# Patient Record
Sex: Male | Born: 1965 | Race: White | Hispanic: No | Smoking: Never smoker
Health system: Southern US, Community
[De-identification: ages and names within clinical notes are randomized; demographics above are authoritative.]

## PROBLEM LIST (undated history)

## (undated) DIAGNOSIS — Z9889 Other specified postprocedural states: Secondary | ICD-10-CM

## (undated) DIAGNOSIS — K219 Gastro-esophageal reflux disease without esophagitis: Secondary | ICD-10-CM

## (undated) DIAGNOSIS — J209 Acute bronchitis, unspecified: Secondary | ICD-10-CM

## (undated) DIAGNOSIS — F329 Major depressive disorder, single episode, unspecified: Secondary | ICD-10-CM

## (undated) DIAGNOSIS — M722 Plantar fascial fibromatosis: Secondary | ICD-10-CM

## (undated) DIAGNOSIS — E559 Vitamin D deficiency, unspecified: Secondary | ICD-10-CM

## (undated) DIAGNOSIS — I10 Essential (primary) hypertension: Secondary | ICD-10-CM

## (undated) DIAGNOSIS — K589 Irritable bowel syndrome without diarrhea: Secondary | ICD-10-CM

## (undated) DIAGNOSIS — R000 Tachycardia, unspecified: Secondary | ICD-10-CM

## (undated) DIAGNOSIS — N200 Calculus of kidney: Secondary | ICD-10-CM

## (undated) DIAGNOSIS — Z8719 Personal history of other diseases of the digestive system: Secondary | ICD-10-CM

## (undated) DIAGNOSIS — G971 Other reaction to spinal and lumbar puncture: Secondary | ICD-10-CM

## (undated) DIAGNOSIS — M797 Fibromyalgia: Secondary | ICD-10-CM

## (undated) DIAGNOSIS — Z9049 Acquired absence of other specified parts of digestive tract: Secondary | ICD-10-CM

## (undated) DIAGNOSIS — E7800 Pure hypercholesterolemia, unspecified: Secondary | ICD-10-CM

## (undated) DIAGNOSIS — E162 Hypoglycemia, unspecified: Secondary | ICD-10-CM

## (undated) DIAGNOSIS — G8929 Other chronic pain: Secondary | ICD-10-CM

## (undated) DIAGNOSIS — G4730 Sleep apnea, unspecified: Secondary | ICD-10-CM

## (undated) DIAGNOSIS — T883XXA Malignant hyperthermia due to anesthesia, initial encounter: Secondary | ICD-10-CM

## (undated) DIAGNOSIS — E349 Endocrine disorder, unspecified: Secondary | ICD-10-CM

## (undated) DIAGNOSIS — R112 Nausea with vomiting, unspecified: Secondary | ICD-10-CM

## (undated) DIAGNOSIS — F32A Depression, unspecified: Secondary | ICD-10-CM

## (undated) DIAGNOSIS — M549 Dorsalgia, unspecified: Secondary | ICD-10-CM

## (undated) DIAGNOSIS — M069 Rheumatoid arthritis, unspecified: Secondary | ICD-10-CM

## (undated) DIAGNOSIS — S060XAA Concussion with loss of consciousness status unknown, initial encounter: Secondary | ICD-10-CM

## (undated) DIAGNOSIS — F419 Anxiety disorder, unspecified: Secondary | ICD-10-CM

## (undated) DIAGNOSIS — H53002 Unspecified amblyopia, left eye: Secondary | ICD-10-CM

## (undated) DIAGNOSIS — Z8489 Family history of other specified conditions: Secondary | ICD-10-CM

## (undated) DIAGNOSIS — Z9109 Other allergy status, other than to drugs and biological substances: Secondary | ICD-10-CM

## (undated) DIAGNOSIS — Z8711 Personal history of peptic ulcer disease: Secondary | ICD-10-CM

## (undated) DIAGNOSIS — S060X9A Concussion with loss of consciousness of unspecified duration, initial encounter: Secondary | ICD-10-CM

## (undated) DIAGNOSIS — M5124 Other intervertebral disc displacement, thoracic region: Secondary | ICD-10-CM

## (undated) DIAGNOSIS — K469 Unspecified abdominal hernia without obstruction or gangrene: Secondary | ICD-10-CM

## (undated) HISTORY — PX: APPENDECTOMY: SHX54

## (undated) HISTORY — PX: BACK SURGERY: SHX140

## (undated) HISTORY — PX: KNEE SURGERY: SHX244

## (undated) HISTORY — PX: HERNIA REPAIR: SHX51

## (undated) HISTORY — PX: COLONOSCOPY W/ BIOPSIES AND POLYPECTOMY: SHX1376

## (undated) HISTORY — PX: KNEE CARTILAGE SURGERY: SHX688

## (undated) HISTORY — PX: CARDIAC CATHETERIZATION: SHX172

---

## 1998-05-31 ENCOUNTER — Emergency Department (HOSPITAL_COMMUNITY): Admission: EM | Admit: 1998-05-31 | Discharge: 1998-05-31 | Payer: Self-pay | Admitting: Emergency Medicine

## 1998-06-20 ENCOUNTER — Encounter: Admission: RE | Admit: 1998-06-20 | Discharge: 1998-06-29 | Payer: Self-pay | Admitting: *Deleted

## 1998-07-27 ENCOUNTER — Emergency Department (HOSPITAL_COMMUNITY): Admission: EM | Admit: 1998-07-27 | Discharge: 1998-07-27 | Payer: Self-pay | Admitting: Emergency Medicine

## 1998-07-27 ENCOUNTER — Encounter: Payer: Self-pay | Admitting: Emergency Medicine

## 1999-04-30 ENCOUNTER — Encounter: Payer: Self-pay | Admitting: Emergency Medicine

## 1999-04-30 ENCOUNTER — Emergency Department (HOSPITAL_COMMUNITY): Admission: EM | Admit: 1999-04-30 | Discharge: 1999-04-30 | Payer: Self-pay | Admitting: Emergency Medicine

## 1999-07-12 ENCOUNTER — Encounter: Payer: Self-pay | Admitting: Emergency Medicine

## 1999-07-12 ENCOUNTER — Emergency Department (HOSPITAL_COMMUNITY): Admission: EM | Admit: 1999-07-12 | Discharge: 1999-07-12 | Payer: Self-pay | Admitting: Emergency Medicine

## 2003-04-03 ENCOUNTER — Emergency Department (HOSPITAL_COMMUNITY): Admission: EM | Admit: 2003-04-03 | Discharge: 2003-04-03 | Payer: Self-pay | Admitting: Emergency Medicine

## 2005-01-15 ENCOUNTER — Emergency Department (HOSPITAL_COMMUNITY): Admission: EM | Admit: 2005-01-15 | Discharge: 2005-01-15 | Payer: Self-pay | Admitting: Emergency Medicine

## 2005-01-22 ENCOUNTER — Emergency Department (HOSPITAL_COMMUNITY): Admission: EM | Admit: 2005-01-22 | Discharge: 2005-01-22 | Payer: Self-pay | Admitting: Emergency Medicine

## 2005-03-04 ENCOUNTER — Emergency Department (HOSPITAL_COMMUNITY): Admission: EM | Admit: 2005-03-04 | Discharge: 2005-03-04 | Payer: Self-pay | Admitting: Emergency Medicine

## 2005-03-28 ENCOUNTER — Emergency Department (HOSPITAL_COMMUNITY): Admission: EM | Admit: 2005-03-28 | Discharge: 2005-03-28 | Payer: Self-pay | Admitting: Emergency Medicine

## 2005-06-10 ENCOUNTER — Emergency Department (HOSPITAL_COMMUNITY): Admission: EM | Admit: 2005-06-10 | Discharge: 2005-06-10 | Payer: Self-pay | Admitting: Emergency Medicine

## 2005-08-14 ENCOUNTER — Emergency Department (HOSPITAL_COMMUNITY): Admission: EM | Admit: 2005-08-14 | Discharge: 2005-08-14 | Payer: Self-pay | Admitting: Emergency Medicine

## 2005-10-26 ENCOUNTER — Emergency Department (HOSPITAL_COMMUNITY): Admission: EM | Admit: 2005-10-26 | Discharge: 2005-10-26 | Payer: Self-pay | Admitting: Emergency Medicine

## 2005-12-06 ENCOUNTER — Emergency Department (HOSPITAL_COMMUNITY): Admission: EM | Admit: 2005-12-06 | Discharge: 2005-12-07 | Payer: Self-pay | Admitting: *Deleted

## 2005-12-29 ENCOUNTER — Emergency Department (HOSPITAL_COMMUNITY): Admission: EM | Admit: 2005-12-29 | Discharge: 2005-12-29 | Payer: Self-pay | Admitting: Emergency Medicine

## 2006-03-11 ENCOUNTER — Emergency Department (HOSPITAL_COMMUNITY): Admission: EM | Admit: 2006-03-11 | Discharge: 2006-03-11 | Payer: Self-pay | Admitting: Emergency Medicine

## 2006-04-27 ENCOUNTER — Emergency Department (HOSPITAL_COMMUNITY): Admission: EM | Admit: 2006-04-27 | Discharge: 2006-04-27 | Payer: Self-pay | Admitting: Emergency Medicine

## 2006-07-03 ENCOUNTER — Ambulatory Visit: Payer: Self-pay | Admitting: Gastroenterology

## 2006-09-04 ENCOUNTER — Emergency Department (HOSPITAL_COMMUNITY): Admission: EM | Admit: 2006-09-04 | Discharge: 2006-09-04 | Payer: Self-pay | Admitting: Emergency Medicine

## 2006-09-10 ENCOUNTER — Encounter: Admission: RE | Admit: 2006-09-10 | Discharge: 2006-09-10 | Payer: Self-pay | Admitting: Internal Medicine

## 2007-02-01 ENCOUNTER — Emergency Department (HOSPITAL_COMMUNITY): Admission: EM | Admit: 2007-02-01 | Discharge: 2007-02-01 | Payer: Self-pay | Admitting: Emergency Medicine

## 2007-04-05 ENCOUNTER — Emergency Department (HOSPITAL_COMMUNITY): Admission: EM | Admit: 2007-04-05 | Discharge: 2007-04-05 | Payer: Self-pay | Admitting: Emergency Medicine

## 2007-04-14 IMAGING — CR DG CHEST 2V
2 series · 2 of 2 positions shown · non-contrast
Comparison: none

CLINICAL DATA: Cough and patient with elevated blood pressure.  
 CHEST - 2 VIEW:

[view not recorded (1 of 2)]
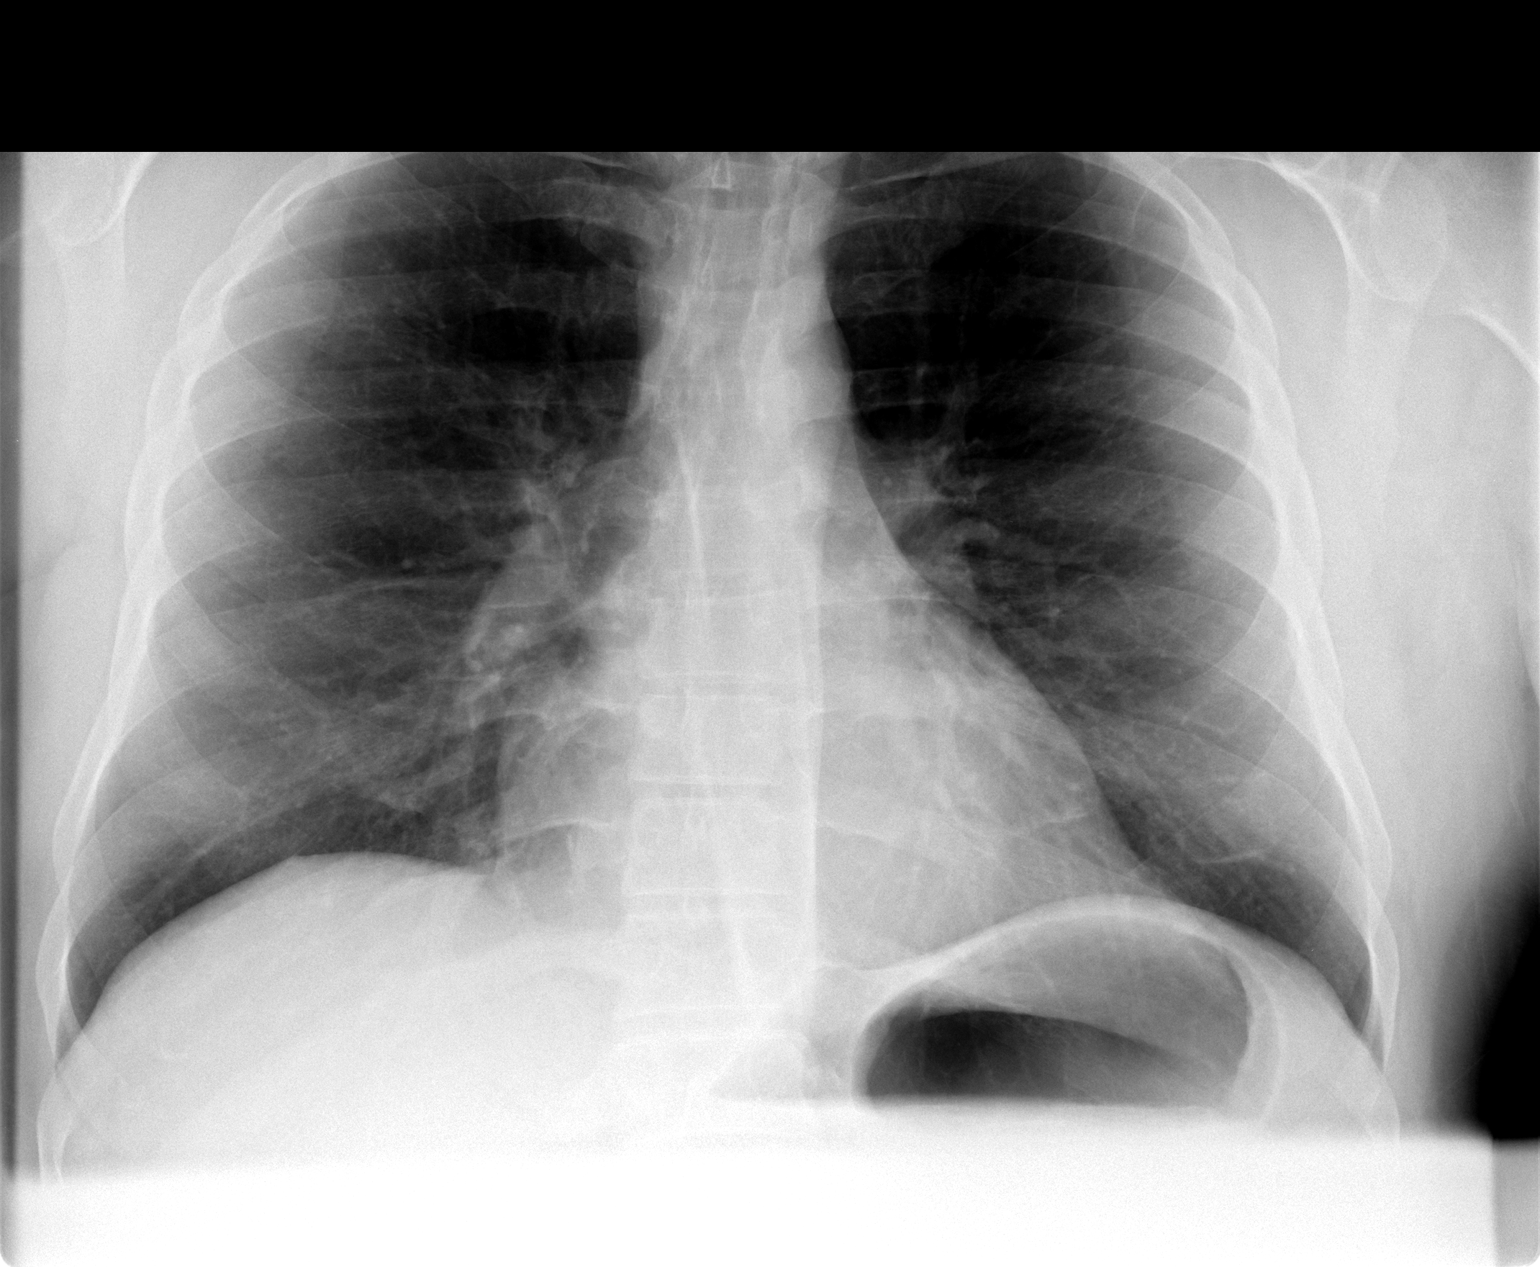

[view not recorded (2 of 2)]
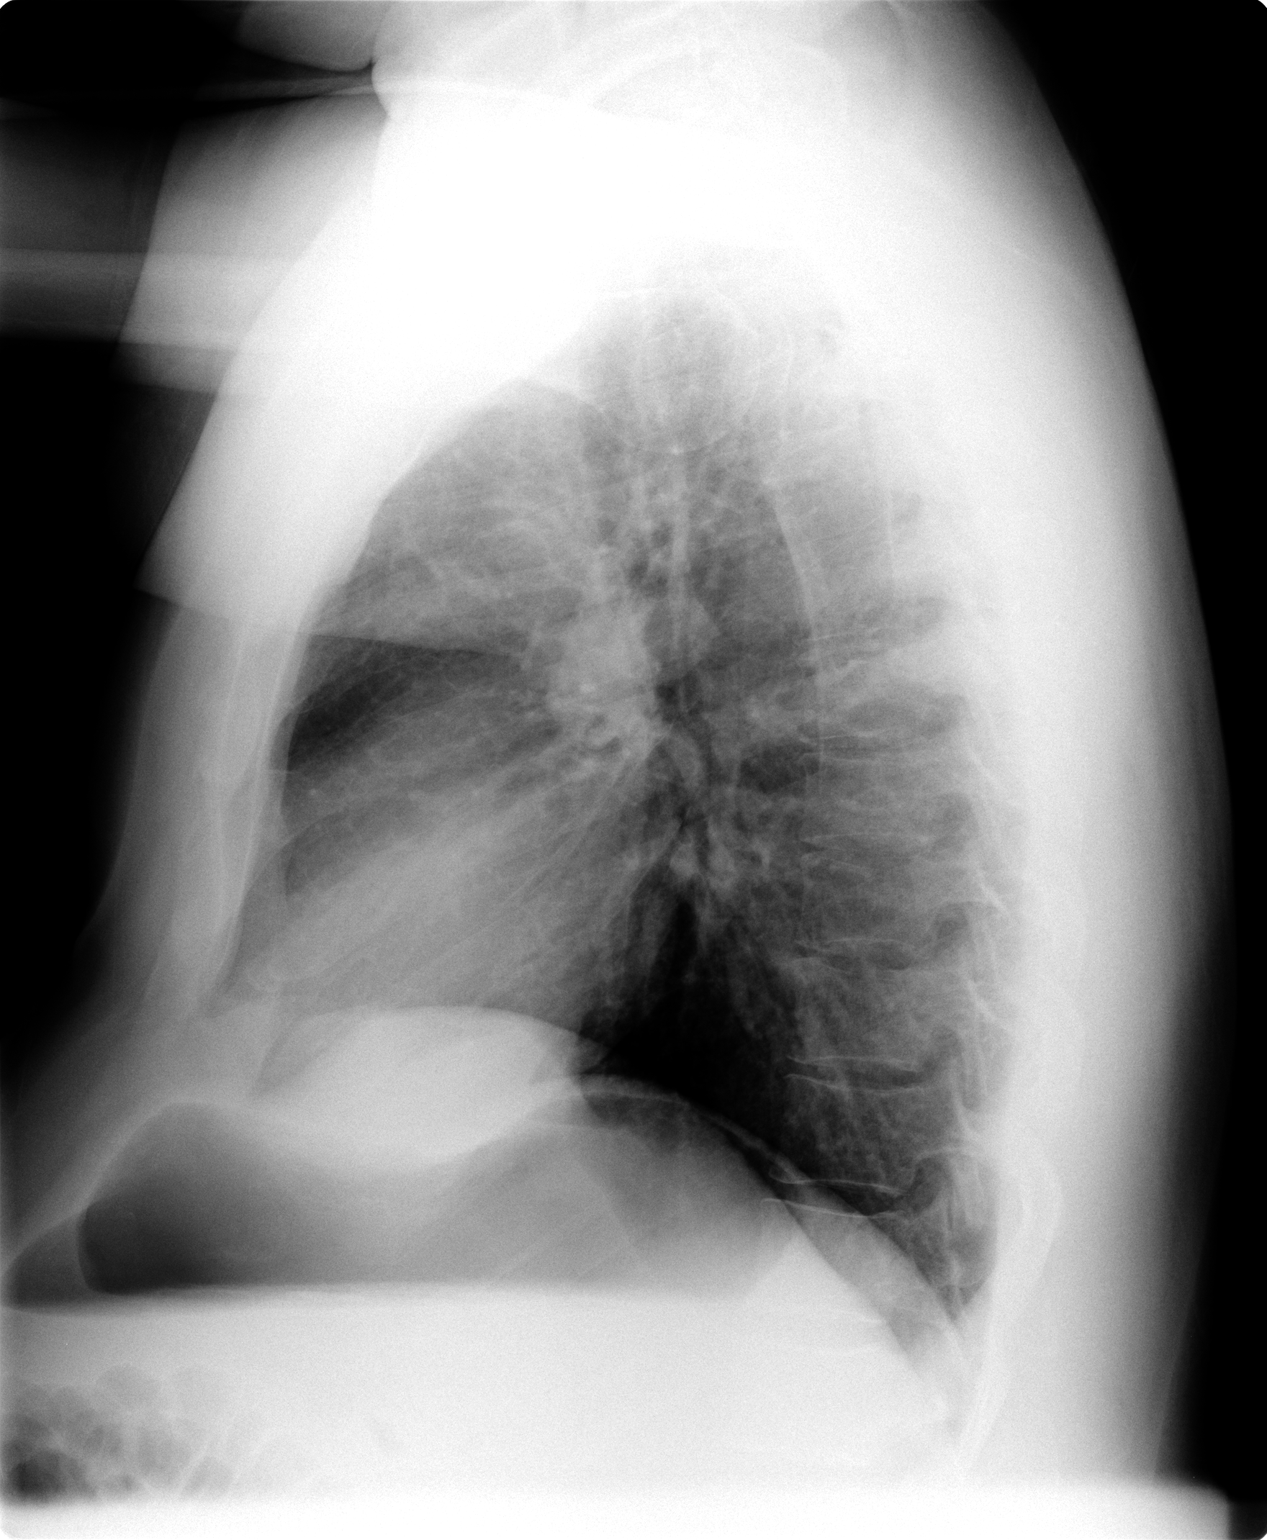

[2 of 2 positions shown; findings below may reference images not displayed]

FINDINGS: There is a small focus of linear opacity in the left base compatible with scar.  Lungs otherwise clear.  Heart size normal.  No effusion.
IMPRESSION: No acute disease.

## 2007-06-15 ENCOUNTER — Emergency Department (HOSPITAL_COMMUNITY): Admission: EM | Admit: 2007-06-15 | Discharge: 2007-06-15 | Payer: Self-pay | Admitting: Emergency Medicine

## 2007-06-21 ENCOUNTER — Emergency Department (HOSPITAL_COMMUNITY): Admission: EM | Admit: 2007-06-21 | Discharge: 2007-06-21 | Payer: Self-pay | Admitting: Emergency Medicine

## 2007-07-01 ENCOUNTER — Emergency Department (HOSPITAL_COMMUNITY): Admission: EM | Admit: 2007-07-01 | Discharge: 2007-07-01 | Payer: Self-pay | Admitting: Emergency Medicine

## 2007-07-14 ENCOUNTER — Emergency Department (HOSPITAL_COMMUNITY): Admission: EM | Admit: 2007-07-14 | Discharge: 2007-07-15 | Payer: Self-pay | Admitting: Emergency Medicine

## 2007-08-25 ENCOUNTER — Emergency Department (HOSPITAL_COMMUNITY): Admission: EM | Admit: 2007-08-25 | Discharge: 2007-08-25 | Payer: Self-pay | Admitting: Emergency Medicine

## 2007-09-08 ENCOUNTER — Emergency Department (HOSPITAL_COMMUNITY): Admission: EM | Admit: 2007-09-08 | Discharge: 2007-09-08 | Payer: Self-pay | Admitting: Emergency Medicine

## 2007-10-06 ENCOUNTER — Emergency Department (HOSPITAL_COMMUNITY): Admission: EM | Admit: 2007-10-06 | Discharge: 2007-10-06 | Payer: Self-pay | Admitting: Emergency Medicine

## 2007-10-07 ENCOUNTER — Emergency Department (HOSPITAL_COMMUNITY): Admission: EM | Admit: 2007-10-07 | Discharge: 2007-10-07 | Payer: Self-pay | Admitting: Emergency Medicine

## 2007-10-24 ENCOUNTER — Emergency Department (HOSPITAL_COMMUNITY): Admission: EM | Admit: 2007-10-24 | Discharge: 2007-10-24 | Payer: Self-pay | Admitting: Emergency Medicine

## 2007-10-31 ENCOUNTER — Emergency Department (HOSPITAL_COMMUNITY): Admission: EM | Admit: 2007-10-31 | Discharge: 2007-10-31 | Payer: Self-pay | Admitting: Emergency Medicine

## 2007-11-17 ENCOUNTER — Emergency Department (HOSPITAL_COMMUNITY): Admission: EM | Admit: 2007-11-17 | Discharge: 2007-11-18 | Payer: Self-pay | Admitting: Emergency Medicine

## 2007-11-18 ENCOUNTER — Ambulatory Visit (HOSPITAL_COMMUNITY): Admission: RE | Admit: 2007-11-18 | Discharge: 2007-11-18 | Payer: Self-pay | Admitting: Emergency Medicine

## 2008-06-05 ENCOUNTER — Emergency Department (HOSPITAL_COMMUNITY): Admission: EM | Admit: 2008-06-05 | Discharge: 2008-06-05 | Payer: Self-pay | Admitting: Emergency Medicine

## 2008-08-06 ENCOUNTER — Emergency Department (HOSPITAL_COMMUNITY): Admission: EM | Admit: 2008-08-06 | Discharge: 2008-08-06 | Payer: Self-pay | Admitting: Emergency Medicine

## 2008-10-13 ENCOUNTER — Emergency Department (HOSPITAL_COMMUNITY): Admission: EM | Admit: 2008-10-13 | Discharge: 2008-10-13 | Payer: Self-pay | Admitting: Emergency Medicine

## 2008-12-20 ENCOUNTER — Emergency Department (HOSPITAL_COMMUNITY): Admission: EM | Admit: 2008-12-20 | Discharge: 2008-12-21 | Payer: Self-pay | Admitting: Emergency Medicine

## 2009-01-02 ENCOUNTER — Emergency Department (HOSPITAL_COMMUNITY): Admission: EM | Admit: 2009-01-02 | Discharge: 2009-01-02 | Payer: Self-pay | Admitting: Emergency Medicine

## 2009-03-14 ENCOUNTER — Emergency Department (HOSPITAL_COMMUNITY): Admission: EM | Admit: 2009-03-14 | Discharge: 2009-03-14 | Payer: Self-pay | Admitting: Emergency Medicine

## 2009-04-04 ENCOUNTER — Emergency Department (HOSPITAL_COMMUNITY): Admission: EM | Admit: 2009-04-04 | Discharge: 2009-04-04 | Payer: Self-pay | Admitting: Emergency Medicine

## 2009-06-16 ENCOUNTER — Ambulatory Visit (HOSPITAL_COMMUNITY): Admission: RE | Admit: 2009-06-16 | Discharge: 2009-06-17 | Payer: Self-pay | Admitting: Specialist

## 2009-06-16 ENCOUNTER — Encounter (INDEPENDENT_AMBULATORY_CARE_PROVIDER_SITE_OTHER): Payer: Self-pay | Admitting: Specialist

## 2009-07-25 IMAGING — CR DG ABDOMEN ACUTE W/ 1V CHEST
1 series · 1 of 1 positions shown · non-contrast
Comparison: none

CLINICAL DATA: Chest pain, abdominal pain

[w chest pa]
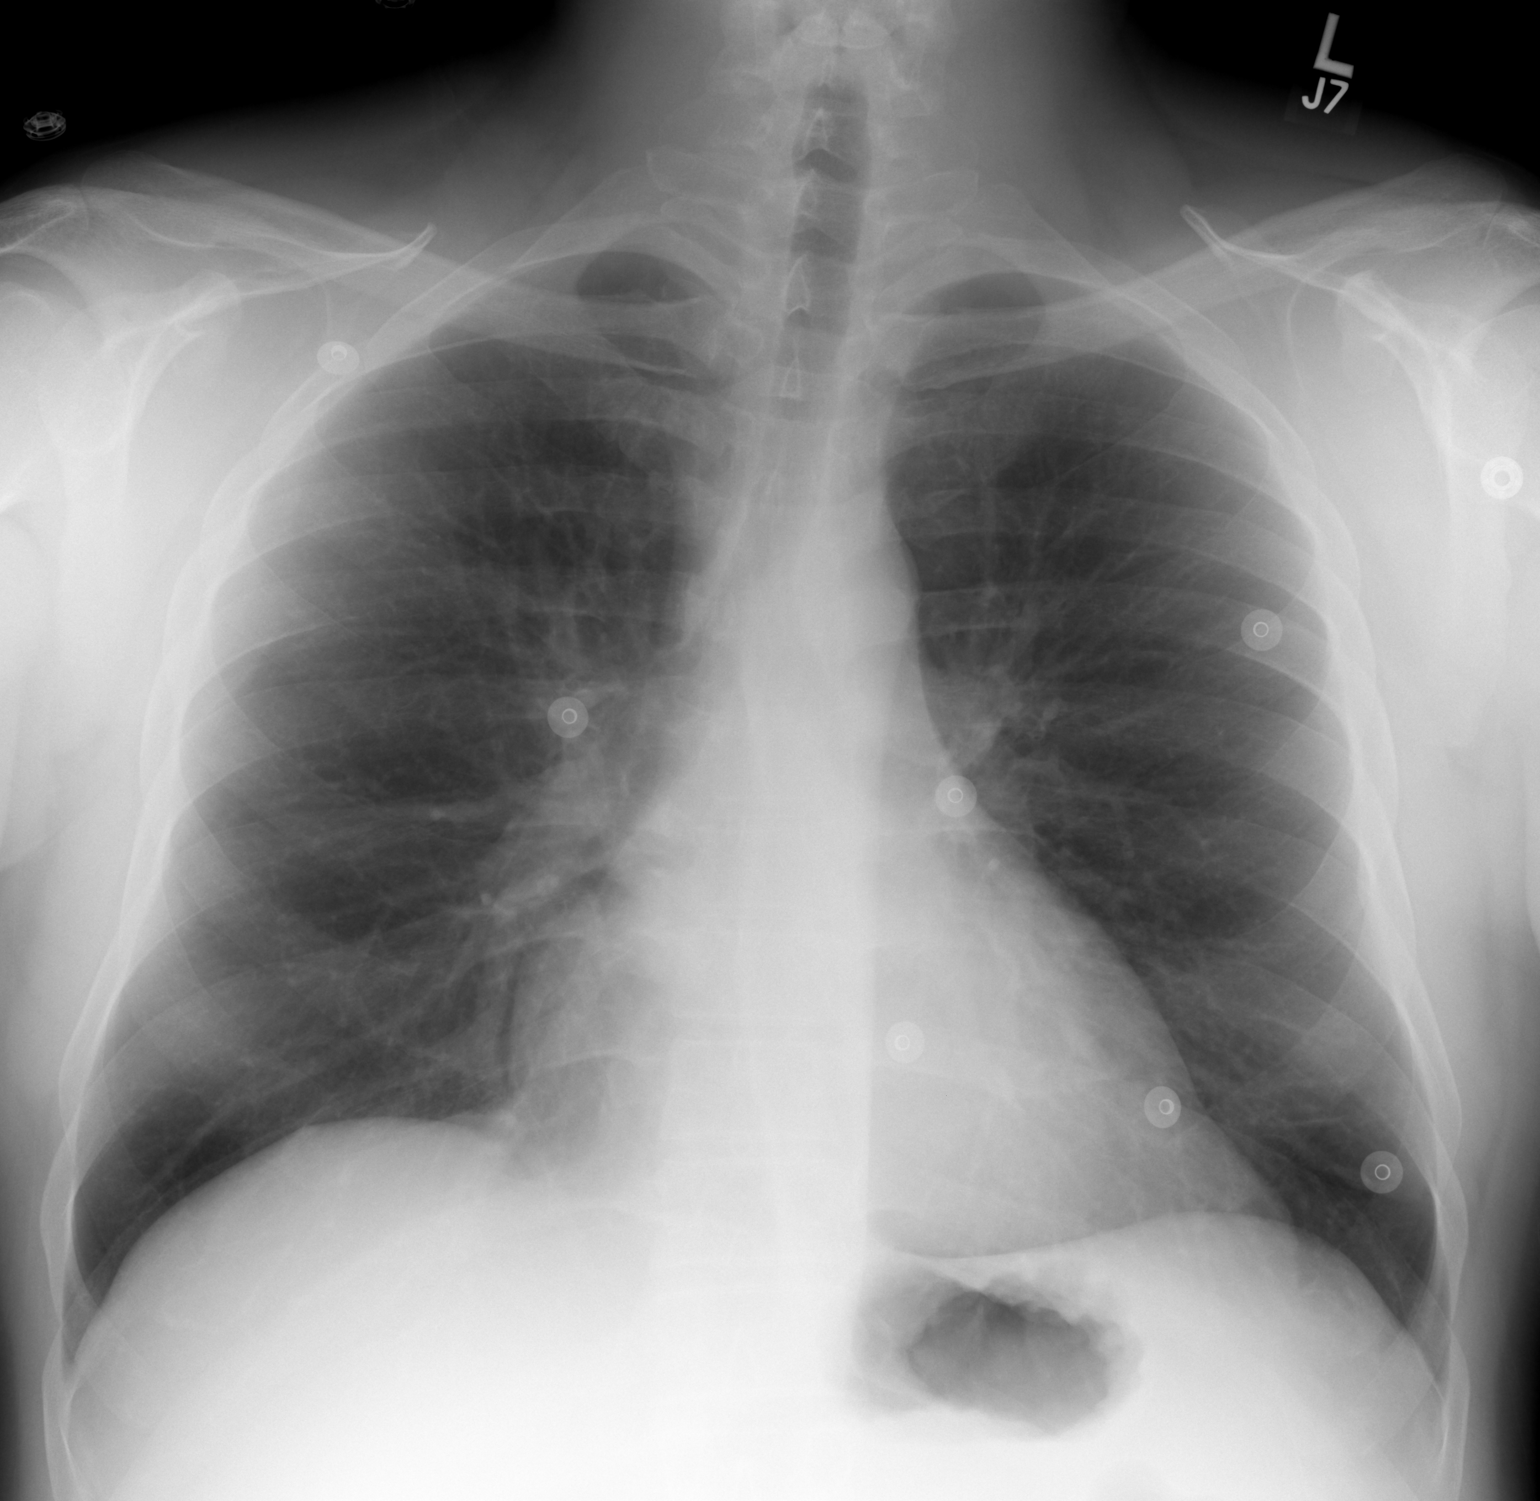

[1 of 1 positions shown; findings below may reference images not displayed]

Acute abdomen with chest:

No previous for comparison. Frontal chest film shows clear lungs. Heart size
upper limits normal. Supine and erect abdomen films show no free air. Small
bowel decompressed. Moderate fecal material in the proximal colon, decompressed
distally. Visualized bones unremarkable. Bilateral pelvic phleboliths.
IMPRESSION: 1. Nonobstructive bowel gas pattern with moderate proximal colonic fecal
material.
2. No acute cardiopulmonary disease

## 2009-07-25 IMAGING — US US SCROTUM
1 series · 14 of 25 positions shown · non-contrast
Comparison: none

CLINICAL DATA: Low back and testicular pain

[Series 1: unknown · 0.07mm/px · 14 of 52 slices shown]
[im 1/52]
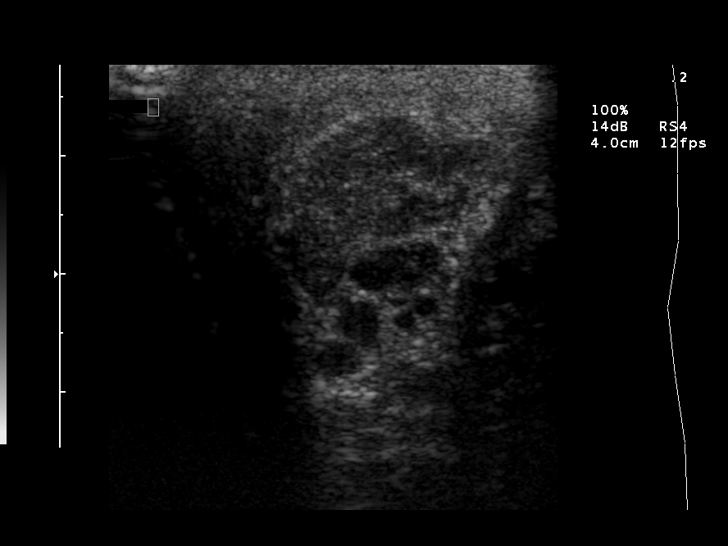
[im 5/52]
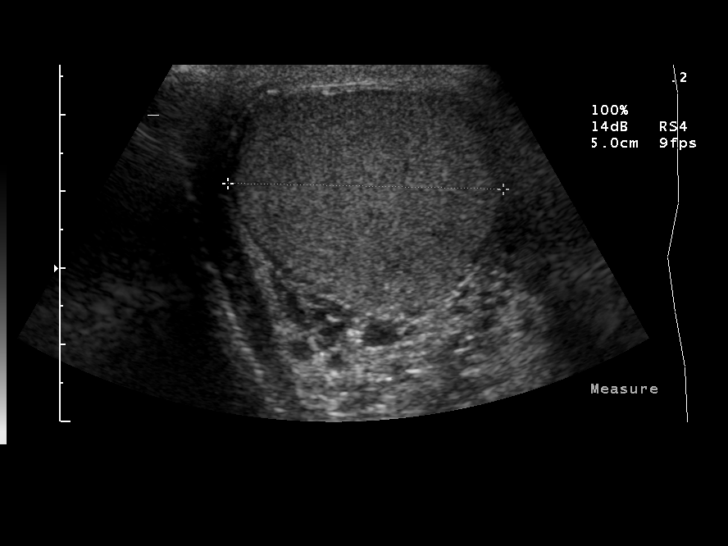
[im 9/52]
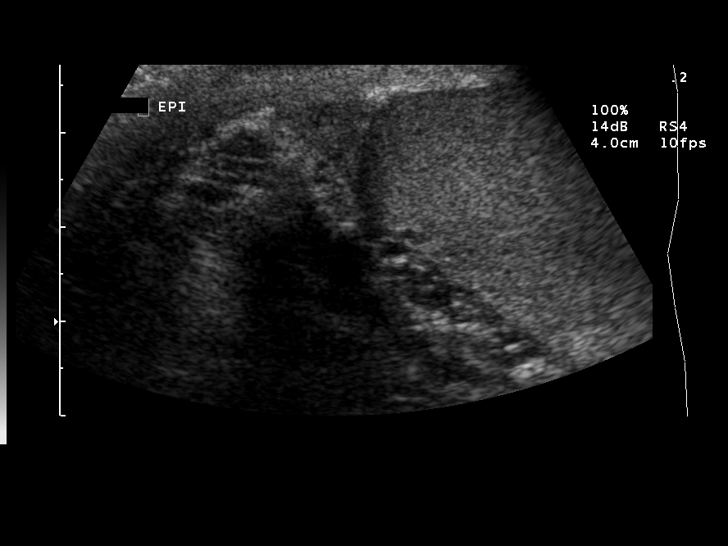
[im 13/52]
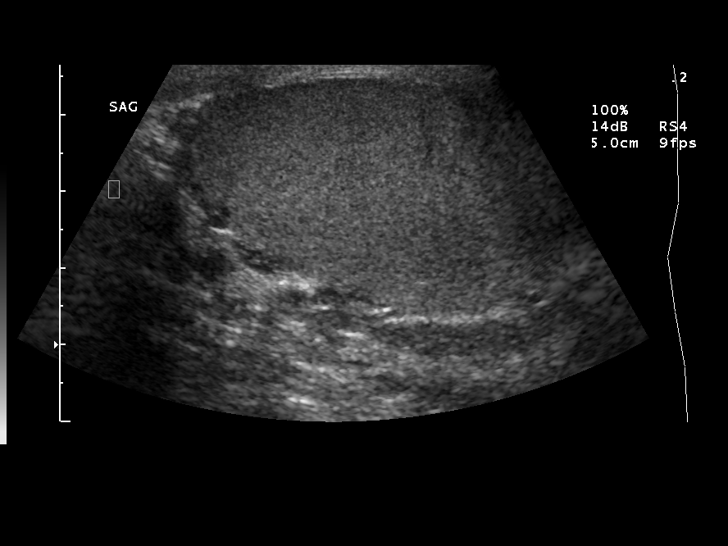
[im 18/52]
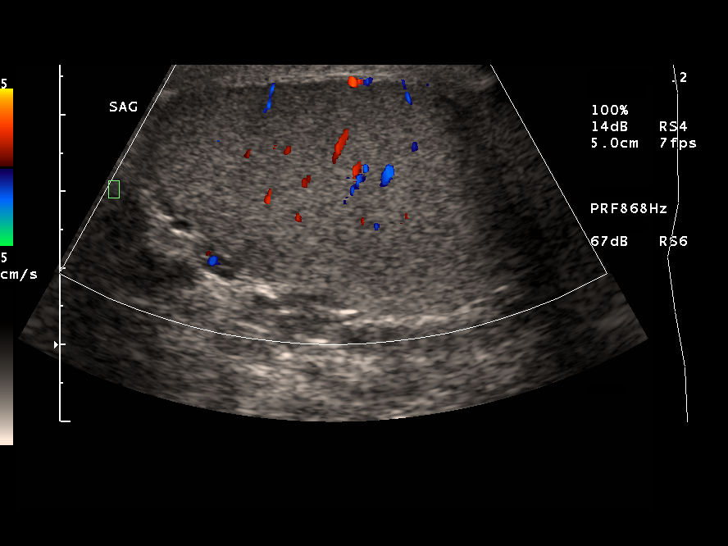
[im 20/52]
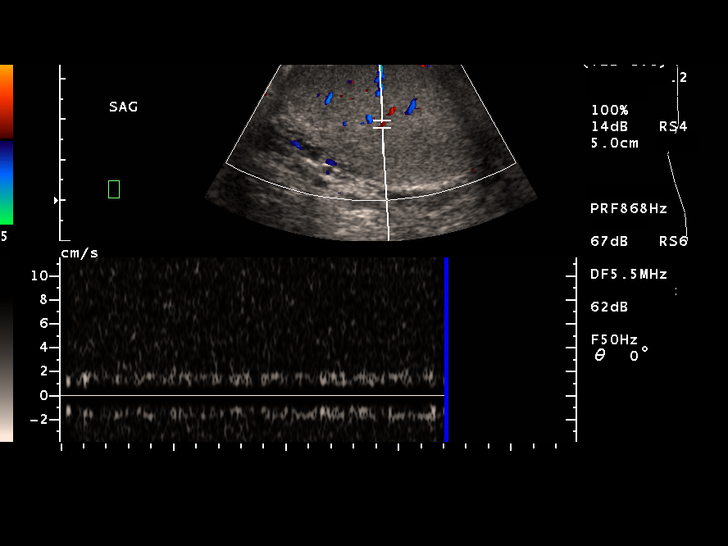
[im 24/52]
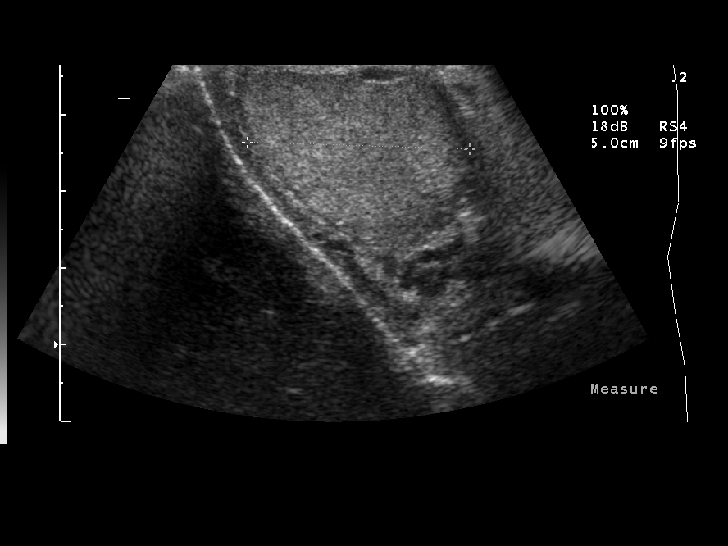
[im 28/52]
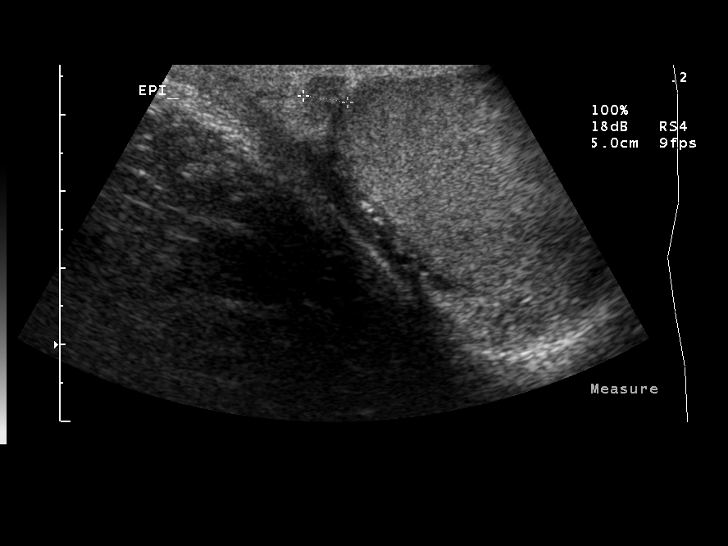
[im 32/52]
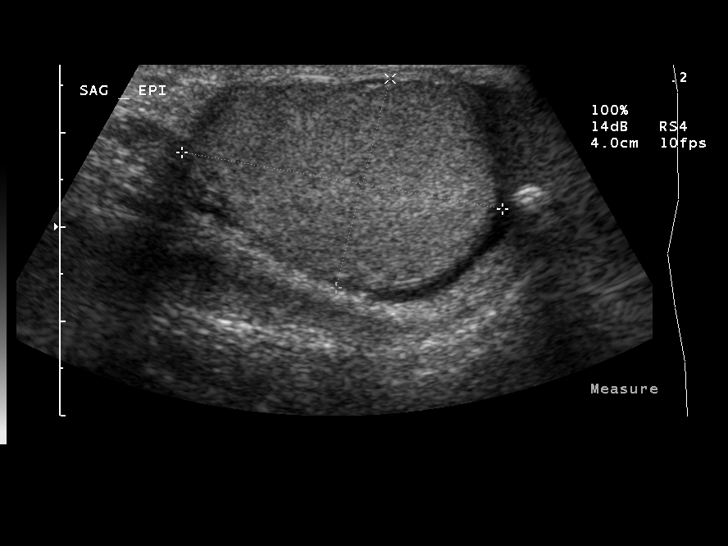
[im 35/52]
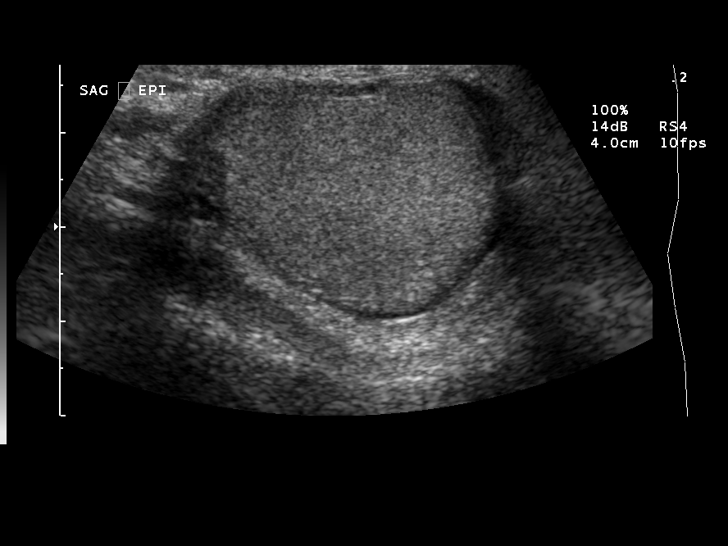
[im 39/52]
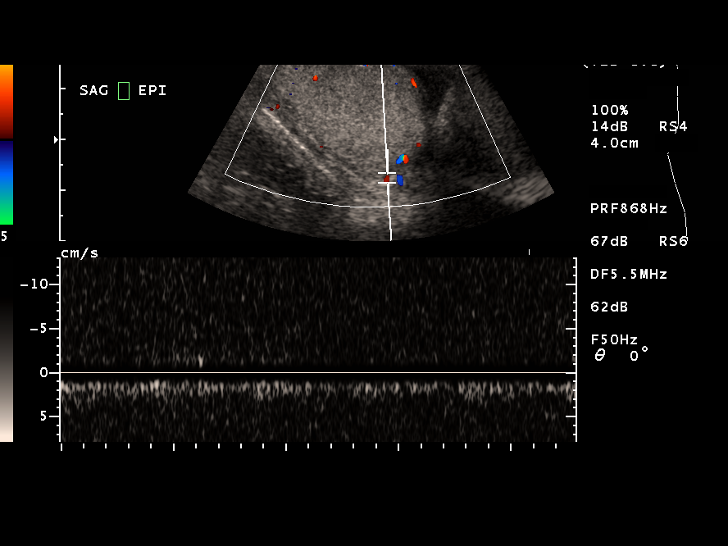
[im 43/52]
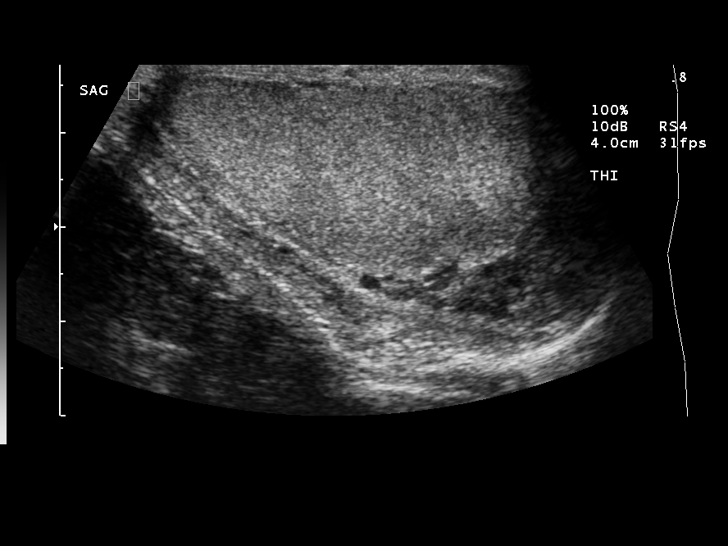
[im 47/52]
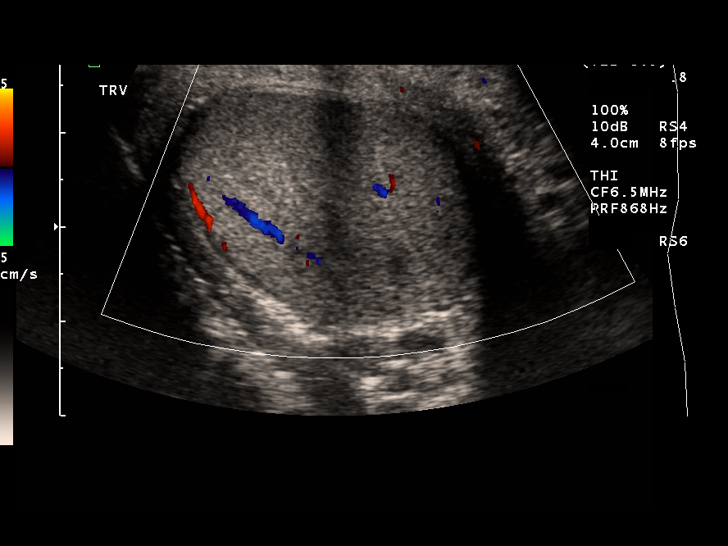
[im 52/52]
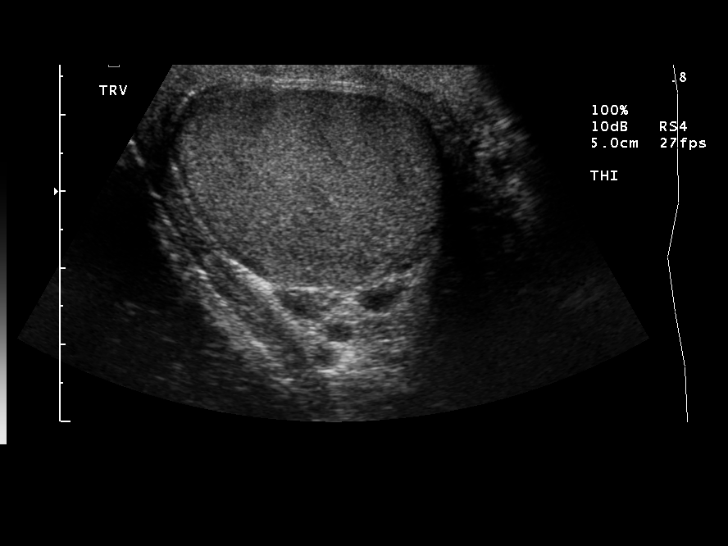

[14 of 25 positions shown; findings below may reference images not displayed]

Testicular ultrasound with vascular Doppler:

No previous for comparison. The right testicle measures 27 x 36 x 54 ml,
homogeneous in echotexture with normal wave forms and color Doppler signal.
Right epididymis unremarkable. Left testicle measures 23 x 29 x 44 mm, similarly
homogeneous in echotexture with normal vascular wave forms and color Doppler
signal. Epididymis unremarkable. No evidence of hydrocele or significant
varicocele.
IMPRESSION: 1. Negative

## 2009-07-25 IMAGING — CR DG ABDOMEN ACUTE W/ 1V CHEST
2 series · 2 of 2 positions shown · non-contrast
Comparison: none

CLINICAL DATA: Chest pain, abdominal pain

[w abdomen upright]
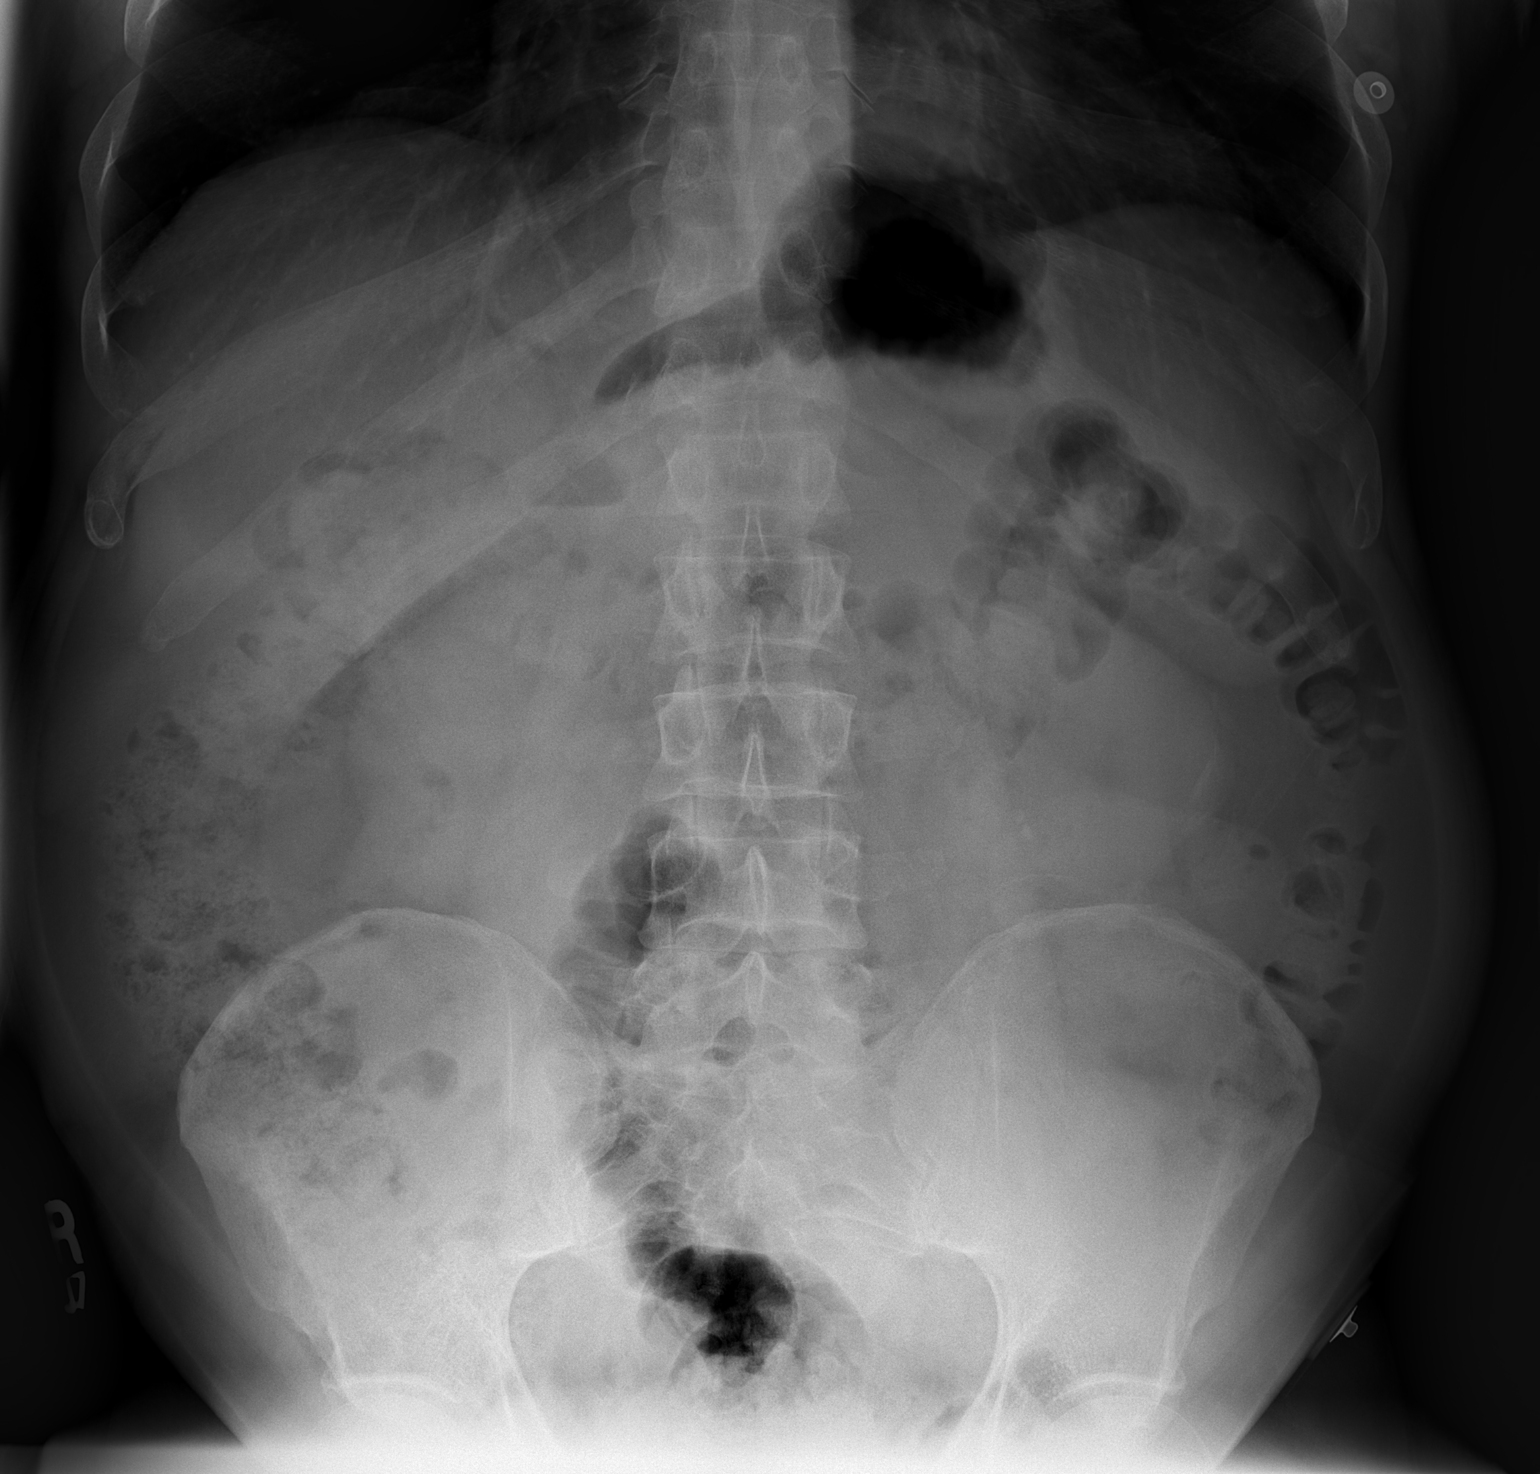

[t abdomen supine]
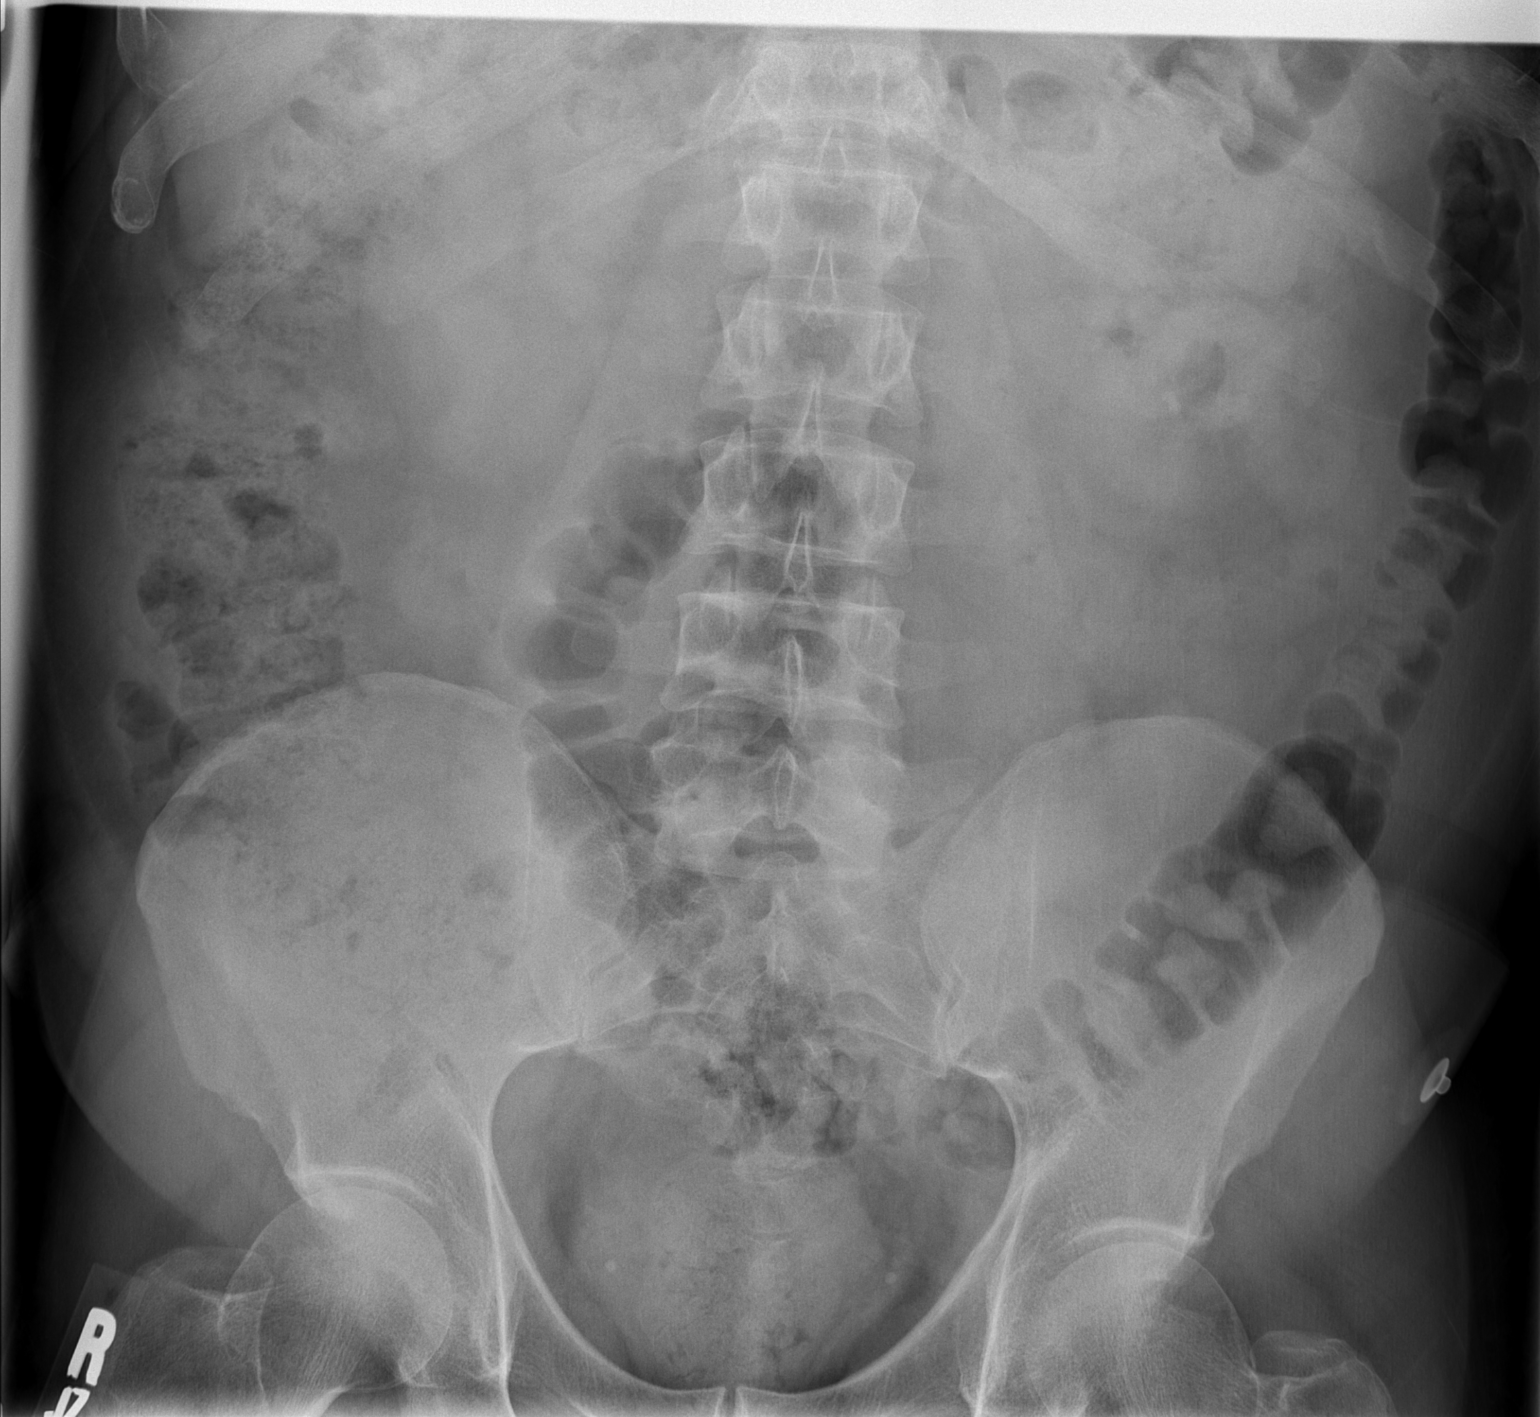

[2 of 2 positions shown; findings below may reference images not displayed]

Acute abdomen with chest:

No previous for comparison. Frontal chest film shows clear lungs. Heart size
upper limits normal. Supine and erect abdomen films show no free air. Small
bowel decompressed. Moderate fecal material in the proximal colon, decompressed
distally. Visualized bones unremarkable. Bilateral pelvic phleboliths.
IMPRESSION: 1. Nonobstructive bowel gas pattern with moderate proximal colonic fecal
material.
2. No acute cardiopulmonary disease

## 2010-07-19 LAB — COMPREHENSIVE METABOLIC PANEL
Alkaline Phosphatase: 102 U/L (ref 39–117)
BUN: 9 mg/dL (ref 6–23)
CO2: 26 mEq/L (ref 19–32)
Chloride: 105 mEq/L (ref 96–112)
GFR calc non Af Amer: >60 mL/min (ref >60)
Glucose, Bld: 102 mg/dL — ABNORMAL HIGH (ref 70–99)
Potassium: 4 mEq/L (ref 3.5–5.1)
Total Bilirubin: 0.6 mg/dL (ref 0.3–1.2)

## 2010-07-19 LAB — CBC
HCT: 43.2 % (ref 39.0–52.0)
Hemoglobin: 15 g/dL (ref 13.0–17.0)
RBC: 5.09 MIL/uL (ref 4.22–5.81)
RDW: 13.9 % (ref 11.5–15.5)
WBC: 9.4 10*3/uL (ref 4.0–10.5)

## 2010-07-19 LAB — PROTIME-INR
INR: 1.04 (ref 0.00–1.49)
Prothrombin Time: 13.5 seconds (ref 11.6–15.2)

## 2010-07-19 LAB — URINALYSIS, ROUTINE W REFLEX MICROSCOPIC
Glucose, UA: NEGATIVE mg/dL
Ketones, ur: NEGATIVE mg/dL
pH: 7.5 (ref 5.0–8.0)

## 2010-08-01 LAB — DIFFERENTIAL
Basophils Absolute: 0.1 10*3/uL (ref 0.0–0.1)
Eosinophils Relative: 2 % (ref 0–5)
Lymphocytes Relative: 28 % (ref 12–46)
Lymphs Abs: 2.6 10*3/uL (ref 0.7–4.0)
Monocytes Absolute: 1 10*3/uL (ref 0.1–1.0)
Neutro Abs: 5.5 10*3/uL (ref 1.7–7.7)

## 2010-08-01 LAB — URINALYSIS, ROUTINE W REFLEX MICROSCOPIC
Bilirubin Urine: NEGATIVE
Nitrite: NEGATIVE
Specific Gravity, Urine: 1.009 (ref 1.005–1.030)
Urobilinogen, UA: 0.2 mg/dL (ref 0.0–1.0)
pH: 7.5 (ref 5.0–8.0)

## 2010-08-01 LAB — COMPREHENSIVE METABOLIC PANEL
AST: 23 U/L (ref 0–37)
Albumin: 3.6 g/dL (ref 3.5–5.2)
BUN: 9 mg/dL (ref 6–23)
Calcium: 8.9 mg/dL (ref 8.4–10.5)
Creatinine, Ser: 0.82 mg/dL (ref 0.4–1.5)
GFR calc Af Amer: >60 mL/min (ref >60)
GFR calc non Af Amer: >60 mL/min (ref >60)

## 2010-08-01 LAB — HEMOCCULT GUIAC POC 1CARD (OFFICE): Fecal Occult Bld: NEGATIVE

## 2010-08-01 LAB — CBC
HCT: 43.9 % (ref 39.0–52.0)
MCHC: 33.4 g/dL (ref 30.0–36.0)
MCV: 85.7 fL (ref 78.0–100.0)
Platelets: 218 10*3/uL (ref 150–400)

## 2010-08-01 LAB — LIPASE, BLOOD: Lipase: 28 U/L (ref 11–59)

## 2010-08-02 LAB — DIFFERENTIAL
Basophils Relative: 1 % (ref 0–1)
Lymphocytes Relative: 30 % (ref 12–46)
Lymphs Abs: 3.2 10*3/uL (ref 0.7–4.0)
Monocytes Relative: 9 % (ref 3–12)
Neutro Abs: 6.3 10*3/uL (ref 1.7–7.7)
Neutrophils Relative %: 59 % (ref 43–77)

## 2010-08-02 LAB — URINALYSIS, ROUTINE W REFLEX MICROSCOPIC
Glucose, UA: NEGATIVE mg/dL
Ketones, ur: NEGATIVE mg/dL
Nitrite: NEGATIVE
Protein, ur: NEGATIVE mg/dL
Urobilinogen, UA: 0.2 mg/dL (ref 0.0–1.0)

## 2010-08-02 LAB — URINE CULTURE
Colony Count: NO GROWTH
Culture: NO GROWTH

## 2010-08-02 LAB — CBC
RBC: 5.25 MIL/uL (ref 4.22–5.81)
WBC: 10.8 10*3/uL — ABNORMAL HIGH (ref 4.0–10.5)

## 2010-08-02 LAB — POCT I-STAT, CHEM 8
Chloride: 101 mEq/L (ref 96–112)
Creatinine, Ser: 0.8 mg/dL (ref 0.4–1.5)
Glucose, Bld: 83 mg/dL (ref 70–99)
HCT: 48 % (ref 39.0–52.0)
Potassium: 4.6 mEq/L (ref 3.5–5.1)

## 2010-08-07 LAB — BASIC METABOLIC PANEL
GFR calc non Af Amer: >60 mL/min (ref >60)
Glucose, Bld: 117 mg/dL — ABNORMAL HIGH (ref 70–99)
Potassium: 3.7 mEq/L (ref 3.5–5.1)
Sodium: 142 mEq/L (ref 135–145)

## 2010-08-07 LAB — DIFFERENTIAL
Basophils Absolute: 0.1 10*3/uL (ref 0.0–0.1)
Eosinophils Relative: 3 % (ref 0–5)
Lymphocytes Relative: 36 % (ref 12–46)
Lymphs Abs: 3.8 10*3/uL (ref 0.7–4.0)
Monocytes Absolute: 1.2 10*3/uL — ABNORMAL HIGH (ref 0.1–1.0)

## 2010-08-07 LAB — POCT CARDIAC MARKERS: Troponin i, poc: <0.05 ng/mL (ref 0.00–0.09)

## 2010-08-07 LAB — CBC
HCT: 41.9 % (ref 39.0–52.0)
Hemoglobin: 14.4 g/dL (ref 13.0–17.0)
RDW: 13.7 % (ref 11.5–15.5)

## 2010-08-15 LAB — GLUCOSE, CAPILLARY: Glucose-Capillary: 119 mg/dL — ABNORMAL HIGH (ref 70–99)

## 2010-08-15 LAB — HEMOCCULT GUIAC POC 1CARD (OFFICE): Fecal Occult Bld: NEGATIVE

## 2010-11-24 ENCOUNTER — Emergency Department (HOSPITAL_COMMUNITY): Payer: Self-pay

## 2010-11-24 ENCOUNTER — Emergency Department (HOSPITAL_COMMUNITY)
Admission: EM | Admit: 2010-11-24 | Discharge: 2010-11-24 | Disposition: A | Discharge status: Home / Self Care | Payer: Self-pay | Attending: Emergency Medicine | Admitting: Emergency Medicine

## 2010-11-24 DIAGNOSIS — M549 Dorsalgia, unspecified: Secondary | ICD-10-CM | POA: Insufficient documentation

## 2010-11-24 DIAGNOSIS — G8929 Other chronic pain: Secondary | ICD-10-CM | POA: Insufficient documentation

## 2010-11-24 DIAGNOSIS — F411 Generalized anxiety disorder: Secondary | ICD-10-CM | POA: Insufficient documentation

## 2010-11-24 DIAGNOSIS — R071 Chest pain on breathing: Secondary | ICD-10-CM | POA: Insufficient documentation

## 2010-11-24 DIAGNOSIS — M79609 Pain in unspecified limb: Secondary | ICD-10-CM | POA: Insufficient documentation

## 2010-11-24 LAB — COMPREHENSIVE METABOLIC PANEL
Albumin: 3.9 g/dL (ref 3.5–5.2)
BUN: 13 mg/dL (ref 6–23)
Chloride: 105 mEq/L (ref 96–112)
Creatinine, Ser: 0.78 mg/dL (ref 0.50–1.35)
GFR calc Af Amer: >60 mL/min (ref >60)
Glucose, Bld: 83 mg/dL (ref 70–99)
Total Bilirubin: 0.2 mg/dL — ABNORMAL LOW (ref 0.3–1.2)
Total Protein: 7.6 g/dL (ref 6.0–8.3)

## 2010-11-24 LAB — CK TOTAL AND CKMB (NOT AT ARMC)
CK, MB: 2.7 ng/mL (ref 0.3–4.0)
CK, MB: 2.7 ng/mL (ref 0.3–4.0)
Relative Index: 1.8 (ref 0.0–2.5)
Total CK: 136 U/L (ref 7–232)

## 2010-11-24 LAB — DIFFERENTIAL
Basophils Absolute: 0.1 10*3/uL (ref 0.0–0.1)
Eosinophils Absolute: 0.2 10*3/uL (ref 0.0–0.7)
Eosinophils Relative: 2 % (ref 0–5)

## 2010-11-24 LAB — CBC
Platelets: 268 10*3/uL (ref 150–400)
RDW: 13.6 % (ref 11.5–15.5)
WBC: 12.7 10*3/uL — ABNORMAL HIGH (ref 4.0–10.5)

## 2010-12-12 ENCOUNTER — Emergency Department (HOSPITAL_COMMUNITY): Payer: Self-pay

## 2010-12-12 ENCOUNTER — Encounter (HOSPITAL_COMMUNITY): Payer: Self-pay | Admitting: Radiology

## 2010-12-12 ENCOUNTER — Emergency Department (HOSPITAL_COMMUNITY)
Admission: EM | Admit: 2010-12-12 | Discharge: 2010-12-13 | Disposition: A | Discharge status: Home / Self Care | Payer: Self-pay | Attending: Emergency Medicine | Admitting: Emergency Medicine

## 2010-12-12 DIAGNOSIS — R112 Nausea with vomiting, unspecified: Secondary | ICD-10-CM | POA: Insufficient documentation

## 2010-12-12 DIAGNOSIS — M549 Dorsalgia, unspecified: Secondary | ICD-10-CM | POA: Insufficient documentation

## 2010-12-12 DIAGNOSIS — N201 Calculus of ureter: Secondary | ICD-10-CM | POA: Insufficient documentation

## 2010-12-12 DIAGNOSIS — G8929 Other chronic pain: Secondary | ICD-10-CM | POA: Insufficient documentation

## 2010-12-12 DIAGNOSIS — R3911 Hesitancy of micturition: Secondary | ICD-10-CM | POA: Insufficient documentation

## 2010-12-12 DIAGNOSIS — K469 Unspecified abdominal hernia without obstruction or gangrene: Secondary | ICD-10-CM

## 2010-12-12 DIAGNOSIS — R109 Unspecified abdominal pain: Secondary | ICD-10-CM | POA: Insufficient documentation

## 2010-12-12 DIAGNOSIS — N200 Calculus of kidney: Secondary | ICD-10-CM | POA: Insufficient documentation

## 2010-12-12 DIAGNOSIS — R300 Dysuria: Secondary | ICD-10-CM | POA: Insufficient documentation

## 2010-12-12 HISTORY — DX: Acquired absence of other specified parts of digestive tract: Z90.49

## 2010-12-12 HISTORY — DX: Unspecified abdominal hernia without obstruction or gangrene: K46.9

## 2010-12-12 LAB — DIFFERENTIAL
Basophils Absolute: 0.1 10*3/uL (ref 0.0–0.1)
Eosinophils Relative: 1 % (ref 0–5)
Lymphocytes Relative: 21 % (ref 12–46)
Lymphs Abs: 3.5 10*3/uL (ref 0.7–4.0)
Neutro Abs: 11.7 10*3/uL — ABNORMAL HIGH (ref 1.7–7.7)
Neutrophils Relative %: 69 % (ref 43–77)

## 2010-12-12 LAB — URINE MICROSCOPIC-ADD ON

## 2010-12-12 LAB — URINALYSIS, ROUTINE W REFLEX MICROSCOPIC
Bilirubin Urine: NEGATIVE
Glucose, UA: NEGATIVE mg/dL
Ketones, ur: 15 mg/dL — AB
Leukocytes, UA: NEGATIVE
Protein, ur: NEGATIVE mg/dL

## 2010-12-12 LAB — COMPREHENSIVE METABOLIC PANEL
ALT: 18 U/L (ref 0–53)
AST: 22 U/L (ref 0–37)
Albumin: 4 g/dL (ref 3.5–5.2)
Alkaline Phosphatase: 116 U/L (ref 39–117)
Chloride: 102 mEq/L (ref 96–112)
Potassium: 3.8 mEq/L (ref 3.5–5.1)
Total Bilirubin: 0.2 mg/dL — ABNORMAL LOW (ref 0.3–1.2)

## 2010-12-12 LAB — CBC
HCT: 39.4 % (ref 39.0–52.0)
Hemoglobin: 13.9 g/dL (ref 13.0–17.0)
MCV: 82.1 fL (ref 78.0–100.0)
RBC: 4.8 MIL/uL (ref 4.22–5.81)
WBC: 17.1 10*3/uL — ABNORMAL HIGH (ref 4.0–10.5)

## 2011-01-22 LAB — POCT I-STAT CREATININE: Creatinine, Ser: 1.1

## 2011-01-22 LAB — LIPASE, BLOOD
Lipase: 32
Lipase: 31

## 2011-01-22 LAB — I-STAT 8, (EC8 V) (CONVERTED LAB)
Bicarbonate: 24.3 — ABNORMAL HIGH
Glucose, Bld: 87
Sodium: 139
TCO2: 26
pCO2, Ven: 41.7 — ABNORMAL LOW
pH, Ven: 7.374 — ABNORMAL HIGH

## 2011-01-22 LAB — DIFFERENTIAL
Basophils Absolute: 0.1
Basophils Relative: 1
Eosinophils Absolute: 0.2
Eosinophils Relative: 3
Eosinophils Relative: 3
Lymphocytes Relative: 35
Lymphocytes Relative: 39
Lymphocytes Relative: 38
Lymphs Abs: 3.1
Lymphs Abs: 3.8
Monocytes Absolute: 1.1 — ABNORMAL HIGH
Monocytes Relative: 10
Neutro Abs: 4.6
Neutrophils Relative %: 47

## 2011-01-22 LAB — URINALYSIS, ROUTINE W REFLEX MICROSCOPIC
Bilirubin Urine: NEGATIVE
Glucose, UA: NEGATIVE
Hgb urine dipstick: NEGATIVE
Ketones, ur: NEGATIVE
Nitrite: NEGATIVE
Protein, ur: NEGATIVE
Specific Gravity, Urine: 1.027
pH: 5.5
pH: 5.5

## 2011-01-22 LAB — CBC
HCT: 43.5
HCT: 41.7
Hemoglobin: 14.6
Hemoglobin: 14.7
MCHC: 34.3
MCV: 84.8
MCV: 85.5
MCV: 82.7
Platelets: 216
Platelets: 258
RBC: 5.09
RDW: 14
WBC: 9.7
WBC: 9.8

## 2011-01-22 LAB — COMPREHENSIVE METABOLIC PANEL
AST: 30
Albumin: 3.8
Albumin: 3.9
Alkaline Phosphatase: 111
BUN: 9
Calcium: 9.6
Chloride: 103
Creatinine, Ser: 1
Creatinine, Ser: 0.88
GFR calc Af Amer: >60
GFR calc non Af Amer: >60
Glucose, Bld: 87
Potassium: 4
Total Bilirubin: 0.6

## 2011-01-22 LAB — POCT CARDIAC MARKERS
CKMB, poc: 1.1
Myoglobin, poc: 40.2
Operator id: 257131

## 2011-01-25 LAB — POCT CARDIAC MARKERS
Myoglobin, poc: 28.5
Myoglobin, poc: 42.7
Operator id: 146091
Troponin i, poc: 0.31 — ABNORMAL HIGH

## 2011-01-25 LAB — CBC
HCT: 42.9
HCT: 41.4
HCT: 42.8
Hemoglobin: 14.9
Hemoglobin: 13.9
MCHC: 34.4
MCV: 86.5
MCV: 85.5
Platelets: 265
RBC: 4.79
RBC: 5
RDW: 13.9
WBC: 12.3 — ABNORMAL HIGH
WBC: 10
WBC: 11.1 — ABNORMAL HIGH

## 2011-01-25 LAB — URINE CULTURE

## 2011-01-25 LAB — POCT I-STAT, CHEM 8
BUN: 13
Calcium, Ion: 1.09 — ABNORMAL LOW
Calcium, Ion: 1.22
Chloride: 104
Creatinine, Ser: 1.1
HCT: 45
Hemoglobin: 15.3
TCO2: 30

## 2011-01-25 LAB — COMPREHENSIVE METABOLIC PANEL
ALT: 14
Alkaline Phosphatase: 106
Alkaline Phosphatase: 122 — ABNORMAL HIGH
BUN: 9
BUN: 8
CO2: 25
Chloride: 105
Chloride: 106
GFR calc non Af Amer: >60
Glucose, Bld: 88
Glucose, Bld: 92
Potassium: 3.7
Potassium: 4.1
Sodium: 140
Total Bilirubin: 0.6
Total Bilirubin: 1
Total Protein: 6.9

## 2011-01-25 LAB — URINALYSIS, ROUTINE W REFLEX MICROSCOPIC
Bilirubin Urine: NEGATIVE
Glucose, UA: NEGATIVE
Glucose, UA: NEGATIVE
Glucose, UA: NEGATIVE
Hgb urine dipstick: NEGATIVE
Hgb urine dipstick: NEGATIVE
Ketones, ur: NEGATIVE
Ketones, ur: NEGATIVE
Nitrite: NEGATIVE
Nitrite: NEGATIVE
Protein, ur: NEGATIVE
Protein, ur: NEGATIVE
Specific Gravity, Urine: 1.029
Specific Gravity, Urine: 1.022
Urobilinogen, UA: 1
pH: 6
pH: 6.5

## 2011-01-25 LAB — HEPATIC FUNCTION PANEL
Alkaline Phosphatase: 116
Indirect Bilirubin: 0.5
Total Bilirubin: 0.6

## 2011-01-25 LAB — DIFFERENTIAL
Basophils Absolute: 0.1
Basophils Absolute: 0.1
Basophils Relative: 1
Basophils Relative: 1
Eosinophils Absolute: 0.2
Eosinophils Absolute: 0.2
Eosinophils Relative: 2
Lymphs Abs: 4
Monocytes Relative: 12
Monocytes Relative: 10
Neutro Abs: 7.3
Neutro Abs: 4.4
Neutrophils Relative %: 59
Neutrophils Relative %: 44

## 2011-01-25 LAB — CK TOTAL AND CKMB (NOT AT ARMC): Relative Index: INVALID

## 2011-01-25 LAB — LIPASE, BLOOD
Lipase: 28
Lipase: 30

## 2011-01-25 LAB — TROPONIN I: Troponin I: <0.01

## 2011-01-26 LAB — CBC
HCT: 44.2
Hemoglobin: 15.2
MCHC: 34.4
MCV: 84.3
Platelets: 238
RBC: 5.24
RDW: 13.8
WBC: 11.5 — ABNORMAL HIGH

## 2011-01-26 LAB — LIPASE, BLOOD: Lipase: 28

## 2011-01-26 LAB — COMPREHENSIVE METABOLIC PANEL WITH GFR
AST: 27
Albumin: 3.7
Alkaline Phosphatase: 115
BUN: 8
CO2: 28
Chloride: 103
Creatinine, Ser: 0.72
GFR calc Af Amer: >60
GFR calc non Af Amer: >60
Potassium: 4.4
Total Bilirubin: 0.9

## 2011-01-26 LAB — DIFFERENTIAL
Basophils Absolute: 0.1
Basophils Relative: 1
Eosinophils Absolute: 0.3
Eosinophils Relative: 2
Lymphocytes Relative: 33
Lymphs Abs: 3.7
Monocytes Absolute: 1
Monocytes Relative: 9
Neutro Abs: 6.3
Neutrophils Relative %: 55

## 2011-01-26 LAB — URINALYSIS, ROUTINE W REFLEX MICROSCOPIC
Bilirubin Urine: NEGATIVE
Glucose, UA: NEGATIVE
Hgb urine dipstick: NEGATIVE
Ketones, ur: NEGATIVE
Nitrite: NEGATIVE
Protein, ur: NEGATIVE
Specific Gravity, Urine: 1.011
Urobilinogen, UA: 0.2
pH: 5.5

## 2011-01-26 LAB — OCCULT BLOOD X 1 CARD TO LAB, STOOL: Fecal Occult Bld: NEGATIVE

## 2011-01-26 LAB — COMPREHENSIVE METABOLIC PANEL
ALT: 23
Calcium: 9.5
Glucose, Bld: 94
Sodium: 139
Total Protein: 7.1

## 2013-11-17 ENCOUNTER — Other Ambulatory Visit: Payer: Self-pay | Admitting: Neurosurgery

## 2013-11-25 ENCOUNTER — Other Ambulatory Visit (HOSPITAL_COMMUNITY): Payer: Self-pay | Admitting: *Deleted

## 2013-11-25 NOTE — Pre-Procedure Instructions (Addendum)
Bond Grieshop  11/25/2013   Your procedure is scheduled on:  Friday, December 04, 2013 at 7:30 AM.   Report to [facility] Entrance "A" Admitting Office at 5:30 AM.   Call this number if you have problems the morning of surgery: [phone]   Remember:   Do not eat food or drink liquids after midnight Thursday, 12/03/13.   Take these medicines the morning of surgery with A SIP OF WATER: if needed:gabapentin (NEURONTIN) for pain, acetaminophen (TYLENOL) for headache (pain) Stop taking Aspirin, vitamins, and herbal medications. Do not take any NSAIDs ie: Ibuprofen, Advil, Naproxen or any medication containing Aspirin ( diclofenac (VOLTAREN), ; stop 7 days prior to procedure ( Sat. 11/28/13).    Do not wear jewelry, make-up or nail polish.  Do not wear lotions, powders, or perfumes. You may wear deodorant.  Do not shave 48 hours prior to surgery. Men may shave face and neck.  Do not bring valuables to the hospital.  [facility] is not responsible for any belongings or valuables.               Contacts, dentures or bridgework may not be worn into surgery.  Leave suitcase in the car. After surgery it may be brought to your room.  For patients admitted to the hospital, discharge time is determined by your treatment team.                Special Instructions: [facility] - Preparing for Surgery  Before surgery, you can play an important role.  Because skin is not sterile, your skin needs to be as free of germs as possible.  You can reduce the number of germs on you skin by washing with CHG (chlorahexidine gluconate) soap before surgery.  CHG is an antiseptic cleaner which kills germs and bonds with the skin to continue killing germs even after washing.  Please DO NOT use if you have an allergy to CHG or antibacterial soaps.  If your skin becomes reddened/irritated stop using the CHG and inform your nurse when you arrive at Short Stay.  Do not shave (including legs and underarms) for at least  48 hours prior to the first CHG shower.  You may shave your face.  Please follow these instructions carefully:   1.  Shower with CHG Soap the night before surgery and the morning of Surgery.  2.  If you choose to wash your hair, wash your hair first as usual with your normal shampoo.  3.  After you shampoo, rinse your hair and body thoroughly to remove the Shampoo.  4.  Use CHG as you would any other liquid soap.  You can apply chg directly to the skin and wash gently with scrungie or a clean washcloth.  5.  Apply the CHG Soap to your body ONLY FROM THE NECK DOWN.  Do not use on open wounds or open sores.  Avoid contact with your eyes, ears, mouth and genitals (private parts).  Wash genitals (private parts) with your normal soap.  6.  Wash thoroughly, paying special attention to the area where your surgery will be performed.  7.  Thoroughly rinse your body with warm water from the neck down.  8.  DO NOT shower/wash with your normal soap after using and rinsing off the CHG Soap.  9.  Pat yourself dry with a clean towel.            10.  Wear clean pajamas.  11.  Place clean sheets on your bed the night of your first shower and do not sleep with pets.  Day of Surgery  Do not apply any lotions the morning of surgery.  Please wear clean clothes to the hospital/surgery center.     Please read over the following fact sheets that you were given: Pain Booklet, Coughing and Deep Breathing, MRSA Information and Surgical Site Infection Prevention

## 2013-11-26 ENCOUNTER — Encounter (HOSPITAL_COMMUNITY): Payer: Self-pay

## 2013-11-26 ENCOUNTER — Encounter (HOSPITAL_COMMUNITY)
Admission: RE | Admit: 2013-11-26 | Discharge: 2013-11-26 | Disposition: A | Discharge status: Home / Self Care | Payer: Worker's Compensation | Source: Ambulatory Visit | Attending: Neurosurgery | Admitting: Neurosurgery

## 2013-11-26 DIAGNOSIS — Z01818 Encounter for other preprocedural examination: Secondary | ICD-10-CM | POA: Insufficient documentation

## 2013-11-26 DIAGNOSIS — Z01812 Encounter for preprocedural laboratory examination: Secondary | ICD-10-CM | POA: Insufficient documentation

## 2013-11-26 HISTORY — DX: Personal history of peptic ulcer disease: Z87.11

## 2013-11-26 HISTORY — DX: Plantar fascial fibromatosis: M72.2

## 2013-11-26 HISTORY — DX: Sleep apnea, unspecified: G47.30

## 2013-11-26 HISTORY — DX: Rheumatoid arthritis, unspecified: M06.9

## 2013-11-26 HISTORY — DX: Fibromyalgia: M79.7

## 2013-11-26 HISTORY — DX: Other reaction to spinal and lumbar puncture: G97.1

## 2013-11-26 HISTORY — DX: Family history of other specified conditions: Z84.89

## 2013-11-26 HISTORY — DX: Other intervertebral disc displacement, thoracic region: M51.24

## 2013-11-26 HISTORY — DX: Other chronic pain: G89.29

## 2013-11-26 HISTORY — DX: Concussion with loss of consciousness status unknown, initial encounter: S06.0XAA

## 2013-11-26 HISTORY — DX: Other specified postprocedural states: Z98.890

## 2013-11-26 HISTORY — DX: Essential (primary) hypertension: I10

## 2013-11-26 HISTORY — DX: Dorsalgia, unspecified: M54.9

## 2013-11-26 HISTORY — DX: Personal history of other diseases of the digestive system: Z87.19

## 2013-11-26 HISTORY — DX: Nausea with vomiting, unspecified: R11.2

## 2013-11-26 HISTORY — DX: Vitamin D deficiency, unspecified: E55.9

## 2013-11-26 HISTORY — DX: Calculus of kidney: N20.0

## 2013-11-26 HISTORY — DX: Endocrine disorder, unspecified: E34.9

## 2013-11-26 HISTORY — DX: Depression, unspecified: F32.A

## 2013-11-26 HISTORY — DX: Unspecified amblyopia, left eye: H53.002

## 2013-11-26 HISTORY — DX: Major depressive disorder, single episode, unspecified: F32.9

## 2013-11-26 HISTORY — DX: Pure hypercholesterolemia, unspecified: E78.00

## 2013-11-26 HISTORY — DX: Acute bronchitis, unspecified: J20.9

## 2013-11-26 HISTORY — DX: Malignant hyperthermia due to anesthesia, initial encounter: T88.3XXA

## 2013-11-26 HISTORY — DX: Anxiety disorder, unspecified: F41.9

## 2013-11-26 HISTORY — DX: Hypoglycemia, unspecified: E16.2

## 2013-11-26 HISTORY — DX: Tachycardia, unspecified: R00.0

## 2013-11-26 HISTORY — DX: Irritable bowel syndrome, unspecified: K58.9

## 2013-11-26 HISTORY — DX: Other allergy status, other than to drugs and biological substances: Z91.09

## 2013-11-26 HISTORY — DX: Gastro-esophageal reflux disease without esophagitis: K21.9

## 2013-11-26 HISTORY — DX: Concussion with loss of consciousness of unspecified duration, initial encounter: S06.0X9A

## 2013-11-26 LAB — CBC WITH DIFFERENTIAL/PLATELET
BASOS ABS: 0.1 10*3/uL (ref 0.0–0.1)
Basophils Relative: 0 % (ref 0–1)
EOS PCT: 2 % (ref 0–5)
Eosinophils Absolute: 0.3 10*3/uL (ref 0.0–0.7)
HEMATOCRIT: 46.1 % (ref 39.0–52.0)
HEMOGLOBIN: 15.5 g/dL (ref 13.0–17.0)
Lymphocytes Relative: 35 % (ref 12–46)
Lymphs Abs: 5.8 10*3/uL — ABNORMAL HIGH (ref 0.7–4.0)
MCH: 29.2 pg (ref 26.0–34.0)
MCHC: 33.6 g/dL (ref 30.0–36.0)
MCV: 87 fL (ref 78.0–100.0)
MONOS PCT: 8 % (ref 3–12)
Monocytes Absolute: 1.4 10*3/uL — ABNORMAL HIGH (ref 0.1–1.0)
Neutro Abs: 8.9 10*3/uL — ABNORMAL HIGH (ref 1.7–7.7)
Neutrophils Relative %: 55 % (ref 43–77)
Platelets: 283 10*3/uL (ref 150–400)
RBC: 5.3 MIL/uL (ref 4.22–5.81)
RDW: 13.3 % (ref 11.5–15.5)
WBC: 16.4 10*3/uL — ABNORMAL HIGH (ref 4.0–10.5)

## 2013-11-26 LAB — BASIC METABOLIC PANEL
Anion gap: 14 (ref 5–15)
BUN: 16 mg/dL (ref 6–23)
CALCIUM: 9.3 mg/dL (ref 8.4–10.5)
CHLORIDE: 96 meq/L (ref 96–112)
CO2: 27 meq/L (ref 19–32)
CREATININE: 0.84 mg/dL (ref 0.50–1.35)
GFR calc Af Amer: >90 mL/min (ref >90)
GFR calc non Af Amer: >90 mL/min (ref >90)
GLUCOSE: 91 mg/dL (ref 70–99)
Potassium: 4.6 mEq/L (ref 3.7–5.3)
Sodium: 137 mEq/L (ref 137–147)

## 2013-11-26 LAB — SURGICAL PCR SCREEN
MRSA, PCR: NEGATIVE
Staphylococcus aureus: NEGATIVE

## 2013-11-27 ENCOUNTER — Encounter (HOSPITAL_COMMUNITY): Payer: Self-pay

## 2013-11-27 NOTE — Progress Notes (Addendum)
Anesthesia Chart Review:  Patient is a 48 year old male scheduled for thoracic 3-4 microdiskectomy on 12/04/13 by Dr. Annette Stable.  History includes non-smoker, tachycardia, OSA, HTN, hypercholesterolemia, chronic back pain, fibromyalgia, RA, IBS, GERD, nephrolithiasis, amblyopia of the left eye, PUD, concussion '07, depression, anxiety, hypoglycemia, appendectomy, back surgery. Cardiologist Dr. Verdell Face [facility]) signed a note of cardiac clearance for surgery.  For anesthesia history, he reports POST-OPERATIVE N/V, SPINAL HEADACHES.  He had a severe headaches and N/V after his last surgery at [facility] on 06/16/09 for L4-S1 microdiskectomy.  His FATHER also had a SEVERE REACTION TO SUCCINYLCHOLINE in the mid-90s. Reported he nearly died. His father and family members where told not to take succinylcholine due to possible genetic predisposition for reaction. His father has since died, and patient does not know any specific details. He doesn't known if he required re-intubation or had a high fever. The terns pseudocholinesterase deficiency and malignant hyperthermia did not sound familiar to him.     Echo on 10/29/13 (Care Everywhere) showed: The study was technically difficult. There is borderline concentric left ventricular hypertrophy. Left ventricular systolic function is normal. Ejection Fraction = >55%. Regional wall motion abnormalities cannot be excluded due to limited visualization. Mild mitral regurgitation.  Exercise stress test on 11/21/12 showed: "Negative study - Add Toprol XL 25 mg daily."--According to Dr. Gerarda Gunther notation.   EKG on 07/22/13 [facility]) showed: NSR. Cardiac cath on 09/01/08 ([facility]) showed: normal coronaries.   His last CXR received from [facility] (and in [place]) is from 04/11/12.   Preoperative labs noted.  WBC 16.4.   Patient reports multiple prior surgeries including several at [facility] in the 1990's, surgery at [facility] in '88 and 2003, and surgery at  [facility] in 2011. His anesthesia record from 06/16/09 is scanned under the Notes tab.  (I printed a copy and placed on his chart.).  He reported a severe headache after this anesthesia; however, the procedure involved the lumbar spine so there could have been surgical contributors to his symptoms as well.  I'll attempt to get his 2003 records from [facility] and update one of our anesthesiologists as well.    George Hugh [facility] Short Stay Center/Anesthesiology Phone [phone] 11/27/2013 7:15 PM  Addendum: 11/30/2013 1:25 PM His prior anesthesia records from [facility] are in storage and would take two weeks to obtain.  I did have anesthesiologist Dr. Linna Caprice review available anesthesia records from 2011.  A trigger free induction was used at that time.  Since it is unclear what type of reaction his father had in the past and trigger free induction was used in 2011, Dr. Linna Caprice anticipated need for trigger free induction for this case. I have notified Pierre Bali, CRNA.

## 2013-11-30 ENCOUNTER — Encounter (HOSPITAL_COMMUNITY): Payer: Self-pay

## 2013-12-03 MED ORDER — DEXAMETHASONE SODIUM PHOSPHATE 10 MG/ML IJ SOLN
10.0000 mg | INTRAMUSCULAR | Status: AC
  Administered 2013-12-04: 10 mg via INTRAVENOUS
  Filled 2013-12-03 (×2): qty 1

## 2013-12-03 MED ORDER — CEFAZOLIN SODIUM-DEXTROSE 2-3 GM-% IV SOLR
2.0000 g | INTRAVENOUS | Status: AC
  Administered 2013-12-04: 2 g via INTRAVENOUS
  Filled 2013-12-03: qty 50

## 2013-12-04 ENCOUNTER — Ambulatory Visit (HOSPITAL_COMMUNITY): Payer: Worker's Compensation | Admitting: Anesthesiology

## 2013-12-04 ENCOUNTER — Encounter (HOSPITAL_COMMUNITY): Payer: Worker's Compensation | Admitting: Emergency Medicine

## 2013-12-04 ENCOUNTER — Ambulatory Visit (HOSPITAL_COMMUNITY): Payer: Worker's Compensation

## 2013-12-04 ENCOUNTER — Encounter (HOSPITAL_COMMUNITY)
Admission: RE | Disposition: A | Discharge status: Home / Self Care | Payer: Self-pay | Source: Ambulatory Visit | Attending: Neurosurgery

## 2013-12-04 ENCOUNTER — Observation Stay (HOSPITAL_COMMUNITY)
Admission: RE | Admit: 2013-12-04 | Discharge: 2013-12-05 | Disposition: A | Discharge status: Home / Self Care | Payer: Worker's Compensation | Source: Ambulatory Visit | Attending: Neurosurgery | Admitting: Neurosurgery

## 2013-12-04 ENCOUNTER — Encounter (HOSPITAL_COMMUNITY): Payer: Self-pay | Admitting: *Deleted

## 2013-12-04 ENCOUNTER — Observation Stay (HOSPITAL_COMMUNITY): Payer: Worker's Compensation

## 2013-12-04 DIAGNOSIS — M069 Rheumatoid arthritis, unspecified: Secondary | ICD-10-CM | POA: Insufficient documentation

## 2013-12-04 DIAGNOSIS — E7800 Pure hypercholesterolemia, unspecified: Secondary | ICD-10-CM | POA: Insufficient documentation

## 2013-12-04 DIAGNOSIS — E559 Vitamin D deficiency, unspecified: Secondary | ICD-10-CM | POA: Insufficient documentation

## 2013-12-04 DIAGNOSIS — G4730 Sleep apnea, unspecified: Secondary | ICD-10-CM | POA: Insufficient documentation

## 2013-12-04 DIAGNOSIS — F3289 Other specified depressive episodes: Secondary | ICD-10-CM | POA: Insufficient documentation

## 2013-12-04 DIAGNOSIS — M5104 Intervertebral disc disorders with myelopathy, thoracic region: Principal | ICD-10-CM | POA: Insufficient documentation

## 2013-12-04 DIAGNOSIS — IMO0001 Reserved for inherently not codable concepts without codable children: Secondary | ICD-10-CM | POA: Insufficient documentation

## 2013-12-04 DIAGNOSIS — F411 Generalized anxiety disorder: Secondary | ICD-10-CM | POA: Insufficient documentation

## 2013-12-04 DIAGNOSIS — K589 Irritable bowel syndrome without diarrhea: Secondary | ICD-10-CM | POA: Insufficient documentation

## 2013-12-04 DIAGNOSIS — F329 Major depressive disorder, single episode, unspecified: Secondary | ICD-10-CM | POA: Insufficient documentation

## 2013-12-04 DIAGNOSIS — Z79899 Other long term (current) drug therapy: Secondary | ICD-10-CM | POA: Insufficient documentation

## 2013-12-04 DIAGNOSIS — K219 Gastro-esophageal reflux disease without esophagitis: Secondary | ICD-10-CM | POA: Insufficient documentation

## 2013-12-04 HISTORY — PX: THORACIC DISCECTOMY: SHX6113

## 2013-12-04 SURGERY — THORACIC DISCECTOMY
Anesthesia: General | Site: Back

## 2013-12-04 MED ORDER — HYDROMORPHONE HCL PF 1 MG/ML IJ SOLN: 0.2500 mg | INTRAMUSCULAR | Status: DC | PRN

## 2013-12-04 MED ORDER — ACETAMINOPHEN 325 MG PO TABS: 650.0000 mg | ORAL_TABLET | ORAL | Status: DC | PRN

## 2013-12-04 MED ORDER — ONDANSETRON HCL 4 MG/2ML IJ SOLN
INTRAMUSCULAR | Status: DC | PRN
  Administered 2013-12-04: 4 mg via INTRAVENOUS

## 2013-12-04 MED ORDER — SODIUM CHLORIDE 0.9 % IJ SOLN: 3.0000 mL | INTRAMUSCULAR | Status: DC | PRN

## 2013-12-04 MED ORDER — ROCURONIUM BROMIDE 50 MG/5ML IV SOLN
INTRAVENOUS | Status: AC
  Filled 2013-12-04: qty 1

## 2013-12-04 MED ORDER — ROCURONIUM BROMIDE 100 MG/10ML IV SOLN
INTRAVENOUS | Status: DC | PRN
  Administered 2013-12-04: 50 mg via INTRAVENOUS

## 2013-12-04 MED ORDER — CEFAZOLIN SODIUM 1-5 GM-% IV SOLN
1.0000 g | Freq: Three times a day (TID) | INTRAVENOUS | Status: AC
  Administered 2013-12-04 (×2): 1 g via INTRAVENOUS
  Filled 2013-12-04 (×3): qty 50

## 2013-12-04 MED ORDER — PHENOL 1.4 % MT LIQD: 1.0000 | OROMUCOSAL | Status: DC | PRN

## 2013-12-04 MED ORDER — OXYCODONE HCL 5 MG PO TABS: 5.0000 mg | ORAL_TABLET | Freq: Once | ORAL | Status: DC | PRN

## 2013-12-04 MED ORDER — ONDANSETRON HCL 4 MG/2ML IJ SOLN
4.0000 mg | INTRAMUSCULAR | Status: DC | PRN
  Filled 2013-12-04: qty 2

## 2013-12-04 MED ORDER — ACETAMINOPHEN 650 MG RE SUPP: 650.0000 mg | RECTAL | Status: DC | PRN

## 2013-12-04 MED ORDER — LIDOCAINE HCL (CARDIAC) 20 MG/ML IV SOLN
INTRAVENOUS | Status: AC
  Filled 2013-12-04: qty 5

## 2013-12-04 MED ORDER — 0.9 % SODIUM CHLORIDE (POUR BTL) OPTIME
TOPICAL | Status: DC | PRN
  Administered 2013-12-04: 1000 mL

## 2013-12-04 MED ORDER — ONDANSETRON HCL 4 MG/2ML IJ SOLN
INTRAMUSCULAR | Status: AC
  Filled 2013-12-04: qty 2

## 2013-12-04 MED ORDER — SIMVASTATIN 5 MG PO TABS
5.0000 mg | ORAL_TABLET | Freq: Every day | ORAL | Status: DC
  Administered 2013-12-04: 5 mg via ORAL
  Filled 2013-12-04 (×2): qty 1

## 2013-12-04 MED ORDER — VECURONIUM BROMIDE 10 MG IV SOLR
INTRAVENOUS | Status: AC
  Filled 2013-12-04: qty 10

## 2013-12-04 MED ORDER — LISINOPRIL-HYDROCHLOROTHIAZIDE 20-12.5 MG PO TABS: 1.0000 | ORAL_TABLET | Freq: Every day | ORAL | Status: DC

## 2013-12-04 MED ORDER — MENTHOL 3 MG MT LOZG: 1.0000 | LOZENGE | OROMUCOSAL | Status: DC | PRN

## 2013-12-04 MED ORDER — SODIUM CHLORIDE 0.9 % IJ SOLN
INTRAMUSCULAR | Status: AC
  Filled 2013-12-04: qty 10

## 2013-12-04 MED ORDER — MIDAZOLAM HCL 2 MG/2ML IJ SOLN: 0.5000 mg | Freq: Once | INTRAMUSCULAR | Status: DC | PRN

## 2013-12-04 MED ORDER — NEOSTIGMINE METHYLSULFATE 10 MG/10ML IV SOLN
INTRAVENOUS | Status: DC | PRN
  Administered 2013-12-04: 3 mg via INTRAVENOUS

## 2013-12-04 MED ORDER — KETOROLAC TROMETHAMINE 30 MG/ML IJ SOLN
30.0000 mg | Freq: Four times a day (QID) | INTRAMUSCULAR | Status: DC
  Administered 2013-12-04 – 2013-12-05 (×3): 30 mg via INTRAVENOUS
  Filled 2013-12-04 (×7): qty 1

## 2013-12-04 MED ORDER — LACTATED RINGERS IV SOLN
INTRAVENOUS | Status: DC | PRN
  Administered 2013-12-04 (×2): via INTRAVENOUS

## 2013-12-04 MED ORDER — SUFENTANIL CITRATE 50 MCG/ML IV SOLN
INTRAVENOUS | Status: AC
  Filled 2013-12-04: qty 1

## 2013-12-04 MED ORDER — PROPOFOL 10 MG/ML IV BOLUS
INTRAVENOUS | Status: DC | PRN
  Administered 2013-12-04: 200 mg via INTRAVENOUS

## 2013-12-04 MED ORDER — HYDROMORPHONE HCL PF 1 MG/ML IJ SOLN: 0.5000 mg | INTRAMUSCULAR | Status: DC | PRN

## 2013-12-04 MED ORDER — MIDAZOLAM HCL 5 MG/5ML IJ SOLN
INTRAMUSCULAR | Status: DC | PRN
  Administered 2013-12-04: 2 mg via INTRAVENOUS

## 2013-12-04 MED ORDER — BUPIVACAINE HCL (PF) 0.25 % IJ SOLN
INTRAMUSCULAR | Status: DC | PRN
  Administered 2013-12-04: 20 mL

## 2013-12-04 MED ORDER — CYCLOBENZAPRINE HCL 10 MG PO TABS
10.0000 mg | ORAL_TABLET | Freq: Three times a day (TID) | ORAL | Status: DC | PRN
  Administered 2013-12-04: 10 mg via ORAL
  Filled 2013-12-04: qty 1

## 2013-12-04 MED ORDER — SODIUM CHLORIDE 0.9 % IJ SOLN
3.0000 mL | Freq: Two times a day (BID) | INTRAMUSCULAR | Status: DC
  Administered 2013-12-04 (×2): 3 mL via INTRAVENOUS

## 2013-12-04 MED ORDER — HYDROCHLOROTHIAZIDE 12.5 MG PO CAPS
12.5000 mg | ORAL_CAPSULE | Freq: Every day | ORAL | Status: DC
  Administered 2013-12-04: 12.5 mg via ORAL
  Filled 2013-12-04 (×2): qty 1

## 2013-12-04 MED ORDER — LISINOPRIL 20 MG PO TABS
20.0000 mg | ORAL_TABLET | Freq: Every day | ORAL | Status: DC
  Administered 2013-12-04: 20 mg via ORAL
  Filled 2013-12-04 (×2): qty 1

## 2013-12-04 MED ORDER — STERILE WATER FOR INJECTION IJ SOLN
INTRAMUSCULAR | Status: AC
  Filled 2013-12-04: qty 10

## 2013-12-04 MED ORDER — GLYCOPYRROLATE 0.2 MG/ML IJ SOLN
INTRAMUSCULAR | Status: DC | PRN
  Administered 2013-12-04: 0.4 mg via INTRAVENOUS

## 2013-12-04 MED ORDER — SENNA 8.6 MG PO TABS
1.0000 | ORAL_TABLET | Freq: Two times a day (BID) | ORAL | Status: DC
  Administered 2013-12-04: 8.6 mg via ORAL
  Filled 2013-12-04 (×3): qty 1

## 2013-12-04 MED ORDER — OXYCODONE-ACETAMINOPHEN 5-325 MG PO TABS
1.0000 | ORAL_TABLET | ORAL | Status: DC | PRN
  Administered 2013-12-04 (×2): 2 via ORAL
  Filled 2013-12-04 (×2): qty 2

## 2013-12-04 MED ORDER — SUFENTANIL CITRATE 50 MCG/ML IV SOLN
INTRAVENOUS | Status: DC | PRN
Start: 2013-12-04 — End: 2013-12-04
  Administered 2013-12-04: 30 ug via INTRAVENOUS

## 2013-12-04 MED ORDER — PROMETHAZINE HCL 25 MG/ML IJ SOLN: 6.2500 mg | INTRAMUSCULAR | Status: DC | PRN

## 2013-12-04 MED ORDER — GLYCOPYRROLATE 0.2 MG/ML IJ SOLN
INTRAMUSCULAR | Status: AC
  Filled 2013-12-04: qty 2

## 2013-12-04 MED ORDER — ATENOLOL 25 MG PO TABS
25.0000 mg | ORAL_TABLET | Freq: Every day | ORAL | Status: DC
  Administered 2013-12-04: 25 mg via ORAL
  Filled 2013-12-04 (×2): qty 1

## 2013-12-04 MED ORDER — MIDAZOLAM HCL 2 MG/2ML IJ SOLN
INTRAMUSCULAR | Status: AC
  Filled 2013-12-04: qty 2

## 2013-12-04 MED ORDER — NEOSTIGMINE METHYLSULFATE 10 MG/10ML IV SOLN
INTRAVENOUS | Status: AC
  Filled 2013-12-04: qty 1

## 2013-12-04 MED ORDER — PROPOFOL 10 MG/ML IV BOLUS
INTRAVENOUS | Status: AC
  Filled 2013-12-04: qty 20

## 2013-12-04 MED ORDER — ALUM & MAG HYDROXIDE-SIMETH 200-200-20 MG/5ML PO SUSP
30.0000 mL | Freq: Four times a day (QID) | ORAL | Status: DC | PRN
  Administered 2013-12-04: 30 mL via ORAL
  Filled 2013-12-04: qty 30

## 2013-12-04 MED ORDER — HEMOSTATIC AGENTS (NO CHARGE) OPTIME: TOPICAL | Status: DC | PRN

## 2013-12-04 MED ORDER — KETOROLAC TROMETHAMINE 30 MG/ML IJ SOLN
INTRAMUSCULAR | Status: AC
  Filled 2013-12-04: qty 1

## 2013-12-04 MED ORDER — PROPOFOL 10 MG/ML IV EMUL
INTRAVENOUS | Status: AC
  Administered 2013-12-04: 150 ug/kg/min via INTRAVENOUS
  Administered 2013-12-04: 09:00:00 via INTRAVENOUS
  Filled 2013-12-04: qty 200

## 2013-12-04 MED ORDER — OXYCODONE HCL 5 MG/5ML PO SOLN: 5.0000 mg | Freq: Once | ORAL | Status: DC | PRN

## 2013-12-04 MED ORDER — THROMBIN 20000 UNITS EX SOLR
CUTANEOUS | Status: DC | PRN
  Administered 2013-12-04: 10:00:00 via TOPICAL

## 2013-12-04 MED ORDER — DIPHENHYDRAMINE HCL 50 MG/ML IJ SOLN
INTRAMUSCULAR | Status: DC | PRN
  Administered 2013-12-04: 12.5 mg via INTRAVENOUS

## 2013-12-04 MED ORDER — KETOROLAC TROMETHAMINE 30 MG/ML IJ SOLN
INTRAMUSCULAR | Status: DC | PRN
  Administered 2013-12-04: 30 mg via INTRAVENOUS

## 2013-12-04 MED ORDER — CITALOPRAM HYDROBROMIDE 40 MG PO TABS
40.0000 mg | ORAL_TABLET | Freq: Every day | ORAL | Status: DC
  Administered 2013-12-04: 40 mg via ORAL
  Filled 2013-12-04 (×2): qty 1

## 2013-12-04 MED ORDER — HYDROCODONE-ACETAMINOPHEN 5-325 MG PO TABS: 1.0000 | ORAL_TABLET | ORAL | Status: DC | PRN

## 2013-12-04 MED ORDER — MEPERIDINE HCL 25 MG/ML IJ SOLN: 6.2500 mg | INTRAMUSCULAR | Status: DC | PRN

## 2013-12-04 MED ORDER — THROMBIN 5000 UNITS EX SOLR
CUTANEOUS | Status: DC | PRN
  Administered 2013-12-04: 5000 [IU] via TOPICAL

## 2013-12-04 MED ORDER — SODIUM CHLORIDE 0.9 % IR SOLN
Status: DC | PRN
  Administered 2013-12-04: 09:00:00

## 2013-12-04 MED ORDER — GABAPENTIN 300 MG PO CAPS
300.0000 mg | ORAL_CAPSULE | Freq: Three times a day (TID) | ORAL | Status: DC | PRN
  Filled 2013-12-04: qty 1

## 2013-12-04 MED ORDER — DIPHENHYDRAMINE HCL 50 MG/ML IJ SOLN
INTRAMUSCULAR | Status: AC
  Filled 2013-12-04: qty 1

## 2013-12-04 SURGICAL SUPPLY — 52 items
BAG DECANTER FOR FLEXI CONT (MISCELLANEOUS) ×2 IMPLANT
BENZOIN TINCTURE PRP APPL 2/3 (GAUZE/BANDAGES/DRESSINGS) ×2 IMPLANT
BLADE SURG ROTATE 9660 (MISCELLANEOUS) ×2 IMPLANT
BRUSH SCRUB EZ PLAIN DRY (MISCELLANEOUS) ×2 IMPLANT
BUR CUTTER 7.0 ROUND (BURR) ×2 IMPLANT
BUR MATCHSTICK NEURO 3.0 LAGG (BURR) ×2 IMPLANT
CANISTER SUCT 3000ML (MISCELLANEOUS) ×2 IMPLANT
CONT SPEC 4OZ CLIKSEAL STRL BL (MISCELLANEOUS) ×2 IMPLANT
DECANTER SPIKE VIAL GLASS SM (MISCELLANEOUS) ×2 IMPLANT
DERMABOND ADHESIVE PROPEN (GAUZE/BANDAGES/DRESSINGS) ×1
DERMABOND ADVANCED (GAUZE/BANDAGES/DRESSINGS)
DERMABOND ADVANCED .7 DNX12 (GAUZE/BANDAGES/DRESSINGS) IMPLANT
DERMABOND ADVANCED .7 DNX6 (GAUZE/BANDAGES/DRESSINGS) ×1 IMPLANT
DRAPE LAPAROTOMY 100X72X124 (DRAPES) ×2 IMPLANT
DRAPE MICROSCOPE LEICA (MISCELLANEOUS) ×2 IMPLANT
DRAPE POUCH INSTRU U-SHP 10X18 (DRAPES) ×2 IMPLANT
DRAPE PROXIMA HALF (DRAPES) IMPLANT
DRAPE SURG 17X23 STRL (DRAPES) ×8 IMPLANT
DRSG OPSITE POSTOP 3X4 (GAUZE/BANDAGES/DRESSINGS) ×2 IMPLANT
ELECT REM PT RETURN 9FT ADLT (ELECTROSURGICAL) ×2
ELECTRODE REM PT RTRN 9FT ADLT (ELECTROSURGICAL) ×1 IMPLANT
GAUZE SPONGE 4X4 12PLY STRL (GAUZE/BANDAGES/DRESSINGS) ×2 IMPLANT
GAUZE SPONGE 4X4 16PLY XRAY LF (GAUZE/BANDAGES/DRESSINGS) IMPLANT
GLOVE BIOGEL PI IND STRL 7.0 (GLOVE) ×3 IMPLANT
GLOVE BIOGEL PI INDICATOR 7.0 (GLOVE) ×3
GLOVE ECLIPSE 9.0 STRL (GLOVE) ×2 IMPLANT
GLOVE EXAM NITRILE LRG STRL (GLOVE) IMPLANT
GLOVE EXAM NITRILE MD LF STRL (GLOVE) IMPLANT
GLOVE EXAM NITRILE XL STR (GLOVE) IMPLANT
GLOVE EXAM NITRILE XS STR PU (GLOVE) IMPLANT
GLOVE SURG SS PI 7.0 STRL IVOR (GLOVE) ×6 IMPLANT
GOWN STRL REUS W/ TWL LRG LVL3 (GOWN DISPOSABLE) IMPLANT
GOWN STRL REUS W/ TWL XL LVL3 (GOWN DISPOSABLE) ×2 IMPLANT
GOWN STRL REUS W/TWL 2XL LVL3 (GOWN DISPOSABLE) IMPLANT
GOWN STRL REUS W/TWL LRG LVL3 (GOWN DISPOSABLE)
GOWN STRL REUS W/TWL XL LVL3 (GOWN DISPOSABLE) ×2
KIT BASIN OR (CUSTOM PROCEDURE TRAY) ×2 IMPLANT
KIT ROOM TURNOVER OR (KITS) ×2 IMPLANT
NEEDLE HYPO 22GX1.5 SAFETY (NEEDLE) ×2 IMPLANT
NEEDLE SPNL 22GX3.5 QUINCKE BK (NEEDLE) ×2 IMPLANT
NS IRRIG 1000ML POUR BTL (IV SOLUTION) ×2 IMPLANT
PACK LAMINECTOMY NEURO (CUSTOM PROCEDURE TRAY) ×2 IMPLANT
PAD ARMBOARD 7.5X6 YLW CONV (MISCELLANEOUS) ×10 IMPLANT
RUBBERBAND STERILE (MISCELLANEOUS) ×4 IMPLANT
SPONGE SURGIFOAM ABS GEL SZ50 (HEMOSTASIS) IMPLANT
STRIP CLOSURE SKIN 1/2X4 (GAUZE/BANDAGES/DRESSINGS) ×2 IMPLANT
SUT VIC AB 2-0 CT1 18 (SUTURE) ×2 IMPLANT
SUT VIC AB 3-0 SH 8-18 (SUTURE) ×2 IMPLANT
SYR 20ML ECCENTRIC (SYRINGE) ×2 IMPLANT
TOWEL OR 17X24 6PK STRL BLUE (TOWEL DISPOSABLE) ×2 IMPLANT
TOWEL OR 17X26 10 PK STRL BLUE (TOWEL DISPOSABLE) ×2 IMPLANT
WATER STERILE IRR 1000ML POUR (IV SOLUTION) ×2 IMPLANT

## 2013-12-04 NOTE — Transfer of Care (Signed)
Immediate Anesthesia Transfer of Care Note  Patient: Brent Morton  Procedure(s) Performed: Procedure(s): T/3-4 THORACIC DISCECTOMY (N/A)  Patient Location: PACU  Anesthesia Type:General  Level of Consciousness: sedated  Airway & Oxygen Therapy: Patient Spontanous Breathing and Patient connected to nasal cannula oxygen  Post-op Assessment: Report given to PACU RN, Post -op Vital signs reviewed and stable and Patient moving all extremities  Post vital signs: Reviewed and stable  Complications: No apparent anesthesia complications

## 2013-12-04 NOTE — Plan of Care (Signed)
Problem: Consults Goal: Diagnosis - Spinal Surgery Outcome: Completed/Met Date Met:  12/04/13 Microdiscectomy     

## 2013-12-04 NOTE — Anesthesia Preprocedure Evaluation (Addendum)
Anesthesia Evaluation  Patient identified by MRN, date of birth, ID band Patient awake    Reviewed: Allergy & Precautions, H&P , NPO status , Patient's Chart, lab work & pertinent test results, reviewed documented beta blocker date and time   History of Anesthesia Complications (+) PONV, Family history of anesthesia reaction and history of anesthetic complications (father had life-threatening reaction to Succinylcholine, details unknown)  Airway Mallampati: III TM Distance: >3 FB Neck ROM: Full    Dental  (+) Dental Advisory Given, Poor Dentition, Chipped, Missing, Loose   Pulmonary sleep apnea and Continuous Positive Airway Pressure Ventilation ,  breath sounds clear to auscultation        Cardiovascular hypertension, Pt. on medications and Pt. on home beta blockers Rhythm:Regular Rate:Normal  7/15 ECHO: normal LVF, EF >55%, valves OK '14 stress test: normal   Neuro/Psych Anxiety Depression Chronic back pain    GI/Hepatic Neg liver ROS, GERD-  Controlled,  Endo/Other  Morbid obesity  Renal/GU negative Renal ROS     Musculoskeletal  (+) Arthritis -, Fibromyalgia -  Abdominal (+) + obese,   Peds  Hematology negative hematology ROS (+)   Anesthesia Other Findings   Reproductive/Obstetrics                       Anesthesia Physical Anesthesia Plan  ASA: III  Anesthesia Plan: General   Post-op Pain Management:    Induction: Intravenous  Airway Management Planned: Oral ETT and Video Laryngoscope Planned  Additional Equipment: None  Intra-op Plan:   Post-operative Plan: Extubation in OR  Informed Consent: I have reviewed the patients History and Physical, chart, labs and discussed the procedure including the risks, benefits and alternatives for the proposed anesthesia with the patient or authorized representative who has indicated his/her understanding and acceptance.   Dental advisory  given  Plan Discussed with: CRNA, Anesthesiologist and Surgeon  Anesthesia Plan Comments: (Plan routine monitors, GETA.  Unclear if pt has family h/o pseudocholinesterase deficiency or MH.  Will avoid sux and use only non-triggering anesthetics. )       Anesthesia Quick Evaluation

## 2013-12-04 NOTE — H&P (Signed)
Brent Morton is an 48 y.o. male.   Chief Complaint: Upper back pain HPI: The patient is a 48 year old male with pain in his upper thoracic region extending into his left axilla. He has intermittent numbness and paresthesias extending into his trunk and lower extremities. He has failed conservative management her workup demonstrates evidence of a significant left-sided T3-T4 disc herniation with spinal cord compression. Patient presents now for thoracic microdiscectomy in hopes of improving his situation  Past Medical History  Diagnosis Date  . S/P appy   . Hernia 12/12/2010    hernia repair  . Spinal headache   . PONV (postoperative nausea and vomiting)   . Sleep apnea   . Depression   . Vitamin D deficiency   . Testosterone deficiency   . Family history of anesthesia complication     Father had problems with succinylcholine when be revived during surgery."  . Hypertension   . Environmental allergies   . Hypercholesterolemia   . Anxiety   . Rapid heart beat   . Plantar fasciitis     Bilateral feet  . Chronic back pain   . HNP (herniated nucleus pulposus), thoracic   . History of stomach ulcers   . Fibromyalgia   . Rheumatoid arthritis   . Amblyopia of left eye   . IBS (irritable bowel syndrome)   . Bronchitis, acute   . Kidney stones   . GERD (gastroesophageal reflux disease)   . Concussion     2007 severe  . Hypoglycemia   . Malignant hyperthermia     FHX of severe reaction to succinylcholine `90's (unknown if due to [place] or pseudocholinesterase def, but with hx of trigger free induction 06/06/09 [facility])    Past Surgical History  Procedure Laterality Date  . Appendectomy    . Back surgery    . Hernia repair    . Colonoscopy w/ biopsies and polypectomy    . Knee cartilage surgery      Right  . Knee surgery      torn ligament  . Cardiac catheterization      may 2010 - normal coronaries (HPR)    Family History  Problem Relation Age of Onset  . COPD Father   .  Fibromyalgia Father   . Sleep apnea Father   . Cancer - Prostate Father    Social History:  reports that he has never smoked. He has never used smokeless tobacco. He reports that he drinks alcohol. He reports that he does not use illicit drugs.  Allergies:  Allergies  Allergen Reactions  . Codeine Nausea And Vomiting  . Hydromorphone Hcl Nausea And Vomiting  . Other Nausea And Vomiting    Intolerance to darvocet  . Succinylcholine Chloride Other (See Comments)    States his dad had a severe reaction (almost could not revive him after using) Type unknown and was told anyone in his bloodline should not take this medication    Medications Prior to Admission  Medication Sig Dispense Refill  . acetaminophen (TYLENOL) 500 MG tablet Take 1,000 mg by mouth 2 (two) times daily as needed for headache (pain).      Marland Kitchen atenolol (TENORMIN) 25 MG tablet Take 25 mg by mouth daily with supper.      . Cholecalciferol (VITAMIN D-3) 1000 UNITS CAPS Take 3,000 Units by mouth daily with supper.       . citalopram (CELEXA) 40 MG tablet Take 40 mg by mouth daily with supper.       Marland Kitchen  cyclobenzaprine (AMRIX) 15 MG 24 hr capsule Take 15 mg by mouth daily as needed for muscle spasms.      . diclofenac (VOLTAREN) 75 MG EC tablet Take 75 mg by mouth 2 (two) times daily as needed (pain). Take with food      . gabapentin (NEURONTIN) 300 MG capsule Take 300 mg by mouth every 8 (eight) hours as needed (pain).       Marland Kitchen lisinopril-hydrochlorothiazide (PRINZIDE,ZESTORETIC) 20-12.5 MG per tablet Take 1 tablet by mouth daily with supper.       . pravastatin (PRAVACHOL) 10 MG tablet Take 10 mg by mouth daily with supper.      . cyclobenzaprine (FLEXERIL) 10 MG tablet Take 10 mg by mouth 2 (two) times daily as needed for muscle spasms.         No results found for this or any previous visit (from the past 48 hour(s)). Dg Chest 2 View  12/04/2013   CLINICAL DATA:  Preop for thoracic degeneration  EXAM: CHEST  2 VIEW   COMPARISON:  11/05/2012  FINDINGS: Normal heart size and mediastinal contours. Minimal atelectasis or scar in the left lower lobe. No acute infiltrate or edema. No effusion or pneumothorax. No acute osseous findings.  IMPRESSION: No active cardiopulmonary disease.   Electronically Signed   By: Jorje Guild M.D.   On: 12/04/2013 06:16    A comprehensive review of systems was negative.  Blood pressure 131/76, pulse 72, temperature 98.5 F (36.9 C), temperature source Oral, resp. rate 18, SpO2 96.00%.  Patient is awake and alert. Oriented and appropriate. Cranial nerve function is intact. Motor examination of the upper and lower extremities is normal. Sensory examination reveals some decreased sensation over light touch in his T4 dermatome on the left. Deep tendon raises are brisk in both lower extremities normal in both upper extremities. Examination head ears eyes other is unremarkable her chest and abdomen is benign. Extremities are free from injury or deformity. Assessment/Plan Left T3-T4 herniated pulposus with myelopathy. Plan left T3-T4 transpedicular microdiscectomy. Risks and benefits been explained. Patient wishes to proceed.  Marselino Slayton A 12/04/2013, 7:47 AM

## 2013-12-04 NOTE — Progress Notes (Signed)
Pt's family member brought glasses to short stay and they were sent with OR Transporter, Donnie, to take up to Neuro holding area where Pt was at the time.

## 2013-12-04 NOTE — Op Note (Addendum)
Date of procedure: 8 /07//2015  Date of dictation: Same  Service: Neurosurgery  Preoperative diagnosis: Left T3-T4 herniated nuclear pulposus with myelopathy  Postoperative diagnosis: Same  Procedure Name: Left T3/T4 transpedicular microdiscectomy  Surgeon:Giulliana Mcroberts A.[place] Grosser, M.D.  Asst. Surgeon: None  Anesthesia: General  Indication: 48 year old male with severe upper back pain with radiation to his left chest wall and axillary region. Workup demonstrates evidence of a very significant left-sided T3-T4 disc herniation with spinal cord compression. Patient has failed conservative management and presents now for microdiscectomy in hopes of improving his symptoms.  Operative note: After induction anesthesia, patient positioned prone onto Wilson frame and appropriately padded. Thoracic region prepped and draped sterilely. Incision made overlying T4-3-4. Subperiosteal dissection performed on the left side. Retractor placed. X-ray taken. It and the T23 level was exposed. Dissection was redirected 1 level caudally. Retractor was replaced. Laminotomy was then performed using high-speed drill and Kerrison rongeurs to remove the inferior aspect of lamina of T3 medial aspect the T3-4 facet joint and the superior aspect of the T4 lamina. The superior medial aspect of the left T4 pedicle was resected using the high-speed drill. Microscope brought field these were microdissection. Epidural venous plexus was coagulated and cut. Thecal sac was identified as was the disc herniation. The disc space itself was incised. Using small nerve hooks dispatcher a was then freed from the thecal sac and removed using micropituitary's. The space was entered. Further disc herniation was pushed down using Epstein curettes and all elements the disc herniation appeared to be completely resected. All loose are obviously degenerative tear was removed and interspace. This point a very thorough decompression had been achieved. There was  no evidence of injury to thecal sac and nerve roots. Wound is then irrigated out like solution. Gelfoam was placed topically for hemostasis. Microscope and retractor system were removed. Hemostasis musculature for was and close in layers with Vicryl sutures. Steri-Strips triggers were applied. There were no apparent complications. Patient tolerated the procedure well and he returns to the recovery room postop.

## 2013-12-04 NOTE — Brief Op Note (Signed)
12/04/2013  9:47 AM  PATIENT:  Brent Morton  48 y.o. male  PRE-OPERATIVE DIAGNOSIS:  HNP  POST-OPERATIVE DIAGNOSIS:  HNP  PROCEDURE:  Procedure(s): T/3-4 THORACIC DISCECTOMY (N/A)  SURGEON:  Surgeon(s) and Role:    * Charlie Pitter, MD - Primary  PHYSICIAN ASSISTANT:   ASSISTANTS:    ANESTHESIA:   general  EBL:  Total I/O In: -  Out: 250 [Blood:250]  BLOOD ADMINISTERED:none  DRAINS: none   LOCAL MEDICATIONS USED:  MARCAINE     SPECIMEN:  No Specimen  DISPOSITION OF SPECIMEN:  N/A  COUNTS:  YES  TOURNIQUET:  * No tourniquets in log *  DICTATION: .Dragon Dictation  PLAN OF CARE: Admit for overnight observation  PATIENT DISPOSITION:  PACU - hemodynamically stable.   Delay start of Pharmacological VTE agent (>24hrs) due to surgical blood loss or risk of bleeding: yes

## 2013-12-04 NOTE — Anesthesia Postprocedure Evaluation (Signed)
  Anesthesia Post-op Note  Patient: Brent Morton  Procedure(s) Performed: Procedure(s): T/3-4 THORACIC DISCECTOMY (N/A)  Patient Location: PACU  Anesthesia Type:General  Level of Consciousness: awake, alert , oriented, patient cooperative and responds to stimulation  Airway and Oxygen Therapy: Patient Spontanous Breathing  Post-op Pain: none  Post-op Assessment: Post-op Vital signs reviewed, Patient's Cardiovascular Status Stable, Respiratory Function Stable, Patent Airway, No signs of Nausea or vomiting and Pain level controlled  Post-op Vital Signs: Reviewed and stable  Last Vitals:  Filed Vitals:   12/04/13 1124  BP:   Pulse:   Temp: 36.9 C  Resp:     Complications: No apparent anesthesia complications

## 2013-12-04 NOTE — Anesthesia Procedure Notes (Signed)
Procedure Name: Intubation Date/Time: 12/04/2013 7:59 AM Performed by: Melina Copa, Anny Sayler R Pre-anesthesia Checklist: Patient identified, Emergency Drugs available, Suction available, Patient being monitored and Timeout performed Patient Re-evaluated:Patient Re-evaluated prior to inductionOxygen Delivery Method: Circle system utilized Preoxygenation: Pre-oxygenation with 100% oxygen Intubation Type: IV induction Ventilation: Mask ventilation with difficulty Tube type: Oral Tube size: 8.0 mm Number of attempts: 1 Airway Equipment and Method: Rigid stylet and Video-laryngoscopy Placement Confirmation: ETT inserted through vocal cords under direct vision,  positive ETCO2 and breath sounds checked- equal and bilateral Secured at: 22 cm Tube secured with: Tape Dental Injury: Teeth and Oropharynx as per pre-operative assessment

## 2013-12-05 MED ORDER — OXYCODONE-ACETAMINOPHEN 5-325 MG PO TABS: 1.0000 | ORAL_TABLET | ORAL | Status: DC | PRN

## 2013-12-05 NOTE — Progress Notes (Signed)
Patient alert and oriented, mae's well, voiding adequate amount of urine, swallowing without difficulty, no c/o pain. Patient discharged home with family. Script and discharged instructions given to patient. Patient and family stated understanding of instructions given.  

## 2013-12-05 NOTE — Progress Notes (Signed)
UR Completed.  [place] Fanfan Jane [phone] 12/05/2013  

## 2013-12-05 NOTE — Discharge Instructions (Signed)
Wound Care ?Keep incision covered and dry for two days.  If you shower, cover incision with plastic wrap.  ?Do not put any creams, lotions, or ointments on incision. ?Leave steri-strips on back.  They will fall off by themselves. ? ?Activity ?Walk each and every day, increasing distance each day. ?No lifting greater than 5 lbs.  Avoid excessive neck motion. ?No driving for 2 weeks; may ride as a passenger locally. ? ?Diet ?Resume your normal diet.  ? ?Return to Work ?Will be discussed at you follow up appointment. ? ?Call Your Doctor If Any of These Occur ?Redness, drainage, or swelling at the wound.  ?Temperature greater than 101 degrees. ?Severe pain not relieved by pain medication. ?Incision starts to come apart. ? ?Follow Up Appt ?Call today for appointment in 1-2 weeks [phone]) or for problems.  If you have any hardware placed in your spine, you will need an x-ray before your appointment. ? ?

## 2013-12-05 NOTE — Discharge Summary (Signed)
Physician Discharge Summary  Patient ID: Brent Morton MRN: [id number] DOB/AGE: [id number] y.o.  Admit date: 12/04/2013 Discharge date: 12/05/2013  Admission Diagnoses: Left T3-4 herniated disc, thoracic pain  Discharge Diagnoses: The same Active Problems:   Herniated nucleus pulposus with myelopathy, thoracic   Discharged Condition: good  Hospital Course: Dr. Trenton Gammon performed a left T3-4 discectomy on the patient on 12/04/2013. The surgery went well.  The patient's postoperative course was unremarkable. On postoperative day #1 the patient requested discharge to home. He was given oral and written discharge instructions. All his questions were answered.  Consults: None Significant Diagnostic Studies: None Treatments: Left T3-4 discectomy Discharge Exam: Blood pressure 132/79, pulse 69, temperature 98.6 F (37 C), temperature source Oral, resp. rate 18, SpO2 95.00%. Patient is alert and pleasant. His dressing is clean and dry. His strength is normal.  Disposition: Home  Discharge Instructions   Call MD for:  difficulty breathing, headache or visual disturbances    Complete by:  As directed      Call MD for:  extreme fatigue    Complete by:  As directed      Call MD for:  hives    Complete by:  As directed      Call MD for:  persistant dizziness or light-headedness    Complete by:  As directed      Call MD for:  persistant nausea and vomiting    Complete by:  As directed      Call MD for:  redness, tenderness, or signs of infection (pain, swelling, redness, odor or green/yellow discharge around incision site)    Complete by:  As directed      Call MD for:  severe uncontrolled pain    Complete by:  As directed      Call MD for:  temperature >100.4    Complete by:  As directed      Diet - low sodium heart healthy    Complete by:  As directed      Discharge instructions    Complete by:  As directed   Call [phone] for a followup appointment. Take a stool softener while  you are using pain medications.     Driving Restrictions    Complete by:  As directed   Do not drive for 2 weeks.     Increase activity slowly    Complete by:  As directed      Lifting restrictions    Complete by:  As directed   Do not lift more than 5 pounds. No excessive bending or twisting.     May shower / Bathe    Complete by:  As directed   He may shower after the pain she is removed 3 days after surgery. Leave the incision alone.     Remove dressing in 48 hours    Complete by:  As directed   Your stitches are under the scan and will dissolve by themselves. The Steri-Strips will fall off after you take a few showers. Do not rub back or pick at the wound, Leave the wound alone.            Medication List    STOP taking these medications       acetaminophen 500 MG tablet  Commonly known as:  TYLENOL      TAKE these medications       atenolol 25 MG tablet  Commonly known as:  TENORMIN  Take 25 mg by mouth daily with supper.  citalopram 40 MG tablet  Commonly known as:  CELEXA  Take 40 mg by mouth daily with supper.     cyclobenzaprine 15 MG 24 hr capsule  Commonly known as:  AMRIX  Take 15 mg by mouth daily as needed for muscle spasms.     diclofenac 75 MG EC tablet  Commonly known as:  VOLTAREN  Take 75 mg by mouth 2 (two) times daily as needed (pain). Take with food     gabapentin 300 MG capsule  Commonly known as:  NEURONTIN  Take 300 mg by mouth every 8 (eight) hours as needed (pain).     lisinopril-hydrochlorothiazide 20-12.5 MG per tablet  Commonly known as:  PRINZIDE,ZESTORETIC  Take 1 tablet by mouth daily with supper.     oxyCODONE-acetaminophen 5-325 MG per tablet  Commonly known as:  PERCOCET/ROXICET  Take 1-2 tablets by mouth every 4 (four) hours as needed for moderate pain.     pravastatin 10 MG tablet  Commonly known as:  PRAVACHOL  Take 10 mg by mouth daily with supper.     Vitamin D-3 1000 UNITS Caps  Take 3,000 Units by mouth daily  with supper.         SignedNewman Pies D 12/05/2013, 9:05 AM

## 2013-12-07 ENCOUNTER — Encounter (HOSPITAL_COMMUNITY): Payer: Self-pay | Admitting: Neurosurgery

## 2013-12-08 ENCOUNTER — Encounter (HOSPITAL_COMMUNITY): Payer: Self-pay | Admitting: Emergency Medicine

## 2013-12-08 DIAGNOSIS — Z9089 Acquired absence of other organs: Secondary | ICD-10-CM | POA: Insufficient documentation

## 2013-12-08 DIAGNOSIS — Z8719 Personal history of other diseases of the digestive system: Secondary | ICD-10-CM | POA: Insufficient documentation

## 2013-12-08 DIAGNOSIS — Z87448 Personal history of other diseases of urinary system: Secondary | ICD-10-CM | POA: Insufficient documentation

## 2013-12-08 DIAGNOSIS — IMO0001 Reserved for inherently not codable concepts without codable children: Secondary | ICD-10-CM | POA: Insufficient documentation

## 2013-12-08 DIAGNOSIS — H53009 Unspecified amblyopia, unspecified eye: Secondary | ICD-10-CM | POA: Insufficient documentation

## 2013-12-08 DIAGNOSIS — E7800 Pure hypercholesterolemia, unspecified: Secondary | ICD-10-CM | POA: Insufficient documentation

## 2013-12-08 DIAGNOSIS — F3289 Other specified depressive episodes: Secondary | ICD-10-CM | POA: Insufficient documentation

## 2013-12-08 DIAGNOSIS — I10 Essential (primary) hypertension: Secondary | ICD-10-CM | POA: Insufficient documentation

## 2013-12-08 DIAGNOSIS — Z8709 Personal history of other diseases of the respiratory system: Secondary | ICD-10-CM | POA: Insufficient documentation

## 2013-12-08 DIAGNOSIS — F329 Major depressive disorder, single episode, unspecified: Secondary | ICD-10-CM | POA: Insufficient documentation

## 2013-12-08 DIAGNOSIS — T8140XA Infection following a procedure, unspecified, initial encounter: Secondary | ICD-10-CM | POA: Insufficient documentation

## 2013-12-08 DIAGNOSIS — Z79899 Other long term (current) drug therapy: Secondary | ICD-10-CM | POA: Insufficient documentation

## 2013-12-08 DIAGNOSIS — E559 Vitamin D deficiency, unspecified: Secondary | ICD-10-CM | POA: Insufficient documentation

## 2013-12-08 DIAGNOSIS — F411 Generalized anxiety disorder: Secondary | ICD-10-CM | POA: Insufficient documentation

## 2013-12-08 DIAGNOSIS — Y838 Other surgical procedures as the cause of abnormal reaction of the patient, or of later complication, without mention of misadventure at the time of the procedure: Secondary | ICD-10-CM | POA: Insufficient documentation

## 2013-12-08 DIAGNOSIS — M549 Dorsalgia, unspecified: Secondary | ICD-10-CM | POA: Insufficient documentation

## 2013-12-08 DIAGNOSIS — Z8739 Personal history of other diseases of the musculoskeletal system and connective tissue: Secondary | ICD-10-CM | POA: Insufficient documentation

## 2013-12-08 DIAGNOSIS — G8929 Other chronic pain: Secondary | ICD-10-CM | POA: Insufficient documentation

## 2013-12-08 DIAGNOSIS — Z87442 Personal history of urinary calculi: Secondary | ICD-10-CM | POA: Insufficient documentation

## 2013-12-08 DIAGNOSIS — Z87828 Personal history of other (healed) physical injury and trauma: Secondary | ICD-10-CM | POA: Insufficient documentation

## 2013-12-08 DIAGNOSIS — Z791 Long term (current) use of non-steroidal anti-inflammatories (NSAID): Secondary | ICD-10-CM | POA: Insufficient documentation

## 2013-12-08 DIAGNOSIS — Z9889 Other specified postprocedural states: Secondary | ICD-10-CM | POA: Insufficient documentation

## 2013-12-08 DIAGNOSIS — Z862 Personal history of diseases of the blood and blood-forming organs and certain disorders involving the immune mechanism: Secondary | ICD-10-CM | POA: Insufficient documentation

## 2013-12-08 DIAGNOSIS — Z8639 Personal history of other endocrine, nutritional and metabolic disease: Secondary | ICD-10-CM | POA: Insufficient documentation

## 2013-12-08 LAB — I-STAT CG4 LACTIC ACID, ED: Lactic Acid, Venous: 1.98 mmol/L (ref 0.5–2.2)

## 2013-12-08 LAB — CBC WITH DIFFERENTIAL/PLATELET
BASOS PCT: 0 % (ref 0–1)
Basophils Absolute: 0 10*3/uL (ref 0.0–0.1)
Eosinophils Absolute: 0.3 10*3/uL (ref 0.0–0.7)
Eosinophils Relative: 2 % (ref 0–5)
HCT: 39.2 % (ref 39.0–52.0)
Hemoglobin: 13 g/dL (ref 13.0–17.0)
LYMPHS PCT: 30 % (ref 12–46)
Lymphs Abs: 4.1 10*3/uL — ABNORMAL HIGH (ref 0.7–4.0)
MCH: 28.7 pg (ref 26.0–34.0)
MCHC: 33.2 g/dL (ref 30.0–36.0)
MCV: 86.5 fL (ref 78.0–100.0)
MONO ABS: 0.9 10*3/uL (ref 0.1–1.0)
Monocytes Relative: 7 % (ref 3–12)
NEUTROS PCT: 61 % (ref 43–77)
Neutro Abs: 8.4 10*3/uL — ABNORMAL HIGH (ref 1.7–7.7)
PLATELETS: 304 10*3/uL (ref 150–400)
RBC: 4.53 MIL/uL (ref 4.22–5.81)
RDW: 13 % (ref 11.5–15.5)
WBC: 13.7 10*3/uL — ABNORMAL HIGH (ref 4.0–10.5)

## 2013-12-08 LAB — COMPREHENSIVE METABOLIC PANEL
ALBUMIN: 3.5 g/dL (ref 3.5–5.2)
ALT: 20 U/L (ref 0–53)
AST: 24 U/L (ref 0–37)
Alkaline Phosphatase: 95 U/L (ref 39–117)
Anion gap: 12 (ref 5–15)
BILIRUBIN TOTAL: 0.2 mg/dL — AB (ref 0.3–1.2)
BUN: 16 mg/dL (ref 6–23)
CHLORIDE: 98 meq/L (ref 96–112)
CO2: 28 meq/L (ref 19–32)
CREATININE: 0.94 mg/dL (ref 0.50–1.35)
Calcium: 9.4 mg/dL (ref 8.4–10.5)
GFR calc Af Amer: >90 mL/min (ref >90)
Glucose, Bld: 85 mg/dL (ref 70–99)
POTASSIUM: 5.5 meq/L — AB (ref 3.7–5.3)
SODIUM: 138 meq/L (ref 137–147)
Total Protein: 7.4 g/dL (ref 6.0–8.3)

## 2013-12-08 NOTE — ED Notes (Signed)
CG-4 result reported to Christy Sartorius at [facility].

## 2013-12-08 NOTE — ED Notes (Signed)
Pt c/o back pain tonight. Pt had back surgery on Friday. Roommate performed dressing change. Dressing had green/yellow drainage. Site continues with moderate drainage. Pt is changing dressing once a day.

## 2013-12-09 ENCOUNTER — Emergency Department (HOSPITAL_COMMUNITY)
Admission: EM | Admit: 2013-12-09 | Discharge: 2013-12-09 | Disposition: A | Discharge status: Home / Self Care | Payer: Worker's Compensation | Attending: Emergency Medicine | Admitting: Emergency Medicine

## 2013-12-09 DIAGNOSIS — T798XXA Other early complications of trauma, initial encounter: Secondary | ICD-10-CM

## 2013-12-09 MED ORDER — OXYCODONE-ACETAMINOPHEN 5-325 MG PO TABS
1.0000 | ORAL_TABLET | Freq: Once | ORAL | Status: AC
  Administered 2013-12-09: 1 via ORAL
  Filled 2013-12-09: qty 1

## 2013-12-09 NOTE — Discharge Instructions (Signed)
Discussed with the on-call neurosurgeon. Followup with your neurosurgeon later today for close followup of the wound. Return for any new or worse symptoms.

## 2013-12-09 NOTE — ED Provider Notes (Deleted)
CSN: [id number]     Arrival date & time 12/08/13  2218 History   First MD Initiated Contact with Patient 12/09/13 [phone]     Chief Complaint  Patient presents with  . Back Pain  . Post-op Problem     (Consider location/radiation/quality/duration/timing/severity/associated sxs/prior Treatment) HPI  Past Medical History  Diagnosis Date  . S/P appy   . Hernia 12/12/2010    hernia repair  . Spinal headache   . PONV (postoperative nausea and vomiting)   . Sleep apnea   . Depression   . Vitamin D deficiency   . Testosterone deficiency   . Family history of anesthesia complication     Father had problems with succinylcholine when be revived during surgery."  . Hypertension   . Environmental allergies   . Hypercholesterolemia   . Anxiety   . Rapid heart beat   . Plantar fasciitis     Bilateral feet  . Chronic back pain   . HNP (herniated nucleus pulposus), thoracic   . History of stomach ulcers   . Fibromyalgia   . Rheumatoid arthritis   . Amblyopia of left eye   . IBS (irritable bowel syndrome)   . Bronchitis, acute   . Kidney stones   . GERD (gastroesophageal reflux disease)   . Concussion     2007 severe  . Hypoglycemia   . Malignant hyperthermia     FHX of severe reaction to succinylcholine `90's (unknown if due to [place] or pseudocholinesterase def, but with hx of trigger free induction 06/06/09 [facility])   Past Surgical History  Procedure Laterality Date  . Appendectomy    . Back surgery    . Hernia repair    . Colonoscopy w/ biopsies and polypectomy    . Knee cartilage surgery      Right  . Knee surgery      torn ligament  . Cardiac catheterization      may 2010 - normal coronaries (HPR)  . Thoracic discectomy N/A 12/04/2013    Procedure: T/3-4 THORACIC DISCECTOMY;  Surgeon: Charlie Pitter, MD;  Location: [place] [facility];  Service: Neurosurgery;  Laterality: N/A;   Family History  Problem Relation Age of Onset  . COPD Father   . Fibromyalgia Father   . Sleep apnea  Father   . Cancer - Prostate Father    History  Substance Use Topics  . Smoking status: Never Smoker   . Smokeless tobacco: Never Used  . Alcohol Use: Yes     Comment: social 2-3 months    Review of Systems    Allergies  Codeine; Hydromorphone hcl; Other; and Succinylcholine chloride  Home Medications   Prior to Admission medications   Medication Sig Start Date End Date Taking? Authorizing Provider  atenolol (TENORMIN) 25 MG tablet Take 25 mg by mouth daily with supper.    Historical Provider, MD  Cholecalciferol (VITAMIN D-3) 1000 UNITS CAPS Take 3,000 Units by mouth daily with supper.     Historical Provider, MD  citalopram (CELEXA) 40 MG tablet Take 40 mg by mouth daily with supper.  11/17/13 12/17/13  Historical Provider, MD  cyclobenzaprine (AMRIX) 15 MG 24 hr capsule Take 15 mg by mouth daily as needed for muscle spasms.    Historical Provider, MD  diclofenac (VOLTAREN) 75 MG EC tablet Take 75 mg by mouth 2 (two) times daily as needed (pain). Take with food 08/21/13 08/21/14  Historical Provider, MD  gabapentin (NEURONTIN) 300 MG capsule Take 300 mg by mouth every  8 (eight) hours as needed (pain).  10/15/13   Historical Provider, MD  lisinopril-hydrochlorothiazide (PRINZIDE,ZESTORETIC) 20-12.5 MG per tablet Take 1 tablet by mouth daily with supper.  01/23/13   Historical Provider, MD  oxyCODONE-acetaminophen (PERCOCET/ROXICET) 5-325 MG per tablet Take 1-2 tablets by mouth every 4 (four) hours as needed for moderate pain. 12/05/13   Newman Pies, MD  pravastatin (PRAVACHOL) 10 MG tablet Take 10 mg by mouth daily with supper.    Historical Provider, MD   BP 120/70  Pulse 57  Temp(Src) 98.1 F (36.7 C)  Resp 18  Ht 5\' 11"  (1.803 m)  Wt 248 lb (112.492 kg)  BMI 34.60 kg/m2  SpO2 99% Physical Exam  ED Course  Procedures (including critical care time) Labs Review Labs Reviewed  CBC WITH DIFFERENTIAL - Abnormal; Notable for the following:    WBC 13.7 (*)    Neutro Abs 8.4  (*)    Lymphs Abs 4.1 (*)    All other components within normal limits  COMPREHENSIVE METABOLIC PANEL - Abnormal; Notable for the following:    Potassium 5.5 (*)    Total Bilirubin 0.2 (*)    All other components within normal limits  I-STAT CG4 LACTIC ACID, ED   Results for orders placed during the hospital encounter of 12/09/13  CBC WITH DIFFERENTIAL      Result Value Ref Range   WBC 13.7 (*) 4.0 - 10.5 K/uL   RBC 4.53  4.22 - 5.81 MIL/uL   Hemoglobin 13.0  13.0 - 17.0 g/dL   HCT 39.2  39.0 - 52.0 %   MCV 86.5  78.0 - 100.0 fL   MCH 28.7  26.0 - 34.0 pg   MCHC 33.2  30.0 - 36.0 g/dL   RDW 13.0  11.5 - 15.5 %   Platelets 304  150 - 400 K/uL   Neutrophils Relative % 61  43 - 77 %   Neutro Abs 8.4 (*) 1.7 - 7.7 K/uL   Lymphocytes Relative 30  12 - 46 %   Lymphs Abs 4.1 (*) 0.7 - 4.0 K/uL   Monocytes Relative 7  3 - 12 %   Monocytes Absolute 0.9  0.1 - 1.0 K/uL   Eosinophils Relative 2  0 - 5 %   Eosinophils Absolute 0.3  0.0 - 0.7 K/uL   Basophils Relative 0  0 - 1 %   Basophils Absolute 0.0  0.0 - 0.1 K/uL  COMPREHENSIVE METABOLIC PANEL      Result Value Ref Range   Sodium 138  137 - 147 mEq/L   Potassium 5.5 (*) 3.7 - 5.3 mEq/L   Chloride 98  96 - 112 mEq/L   CO2 28  19 - 32 mEq/L   Glucose, Bld 85  70 - 99 mg/dL   BUN 16  6 - 23 mg/dL   Creatinine, Ser 0.94  0.50 - 1.35 mg/dL   Calcium 9.4  8.4 - 10.5 mg/dL   Total Protein 7.4  6.0 - 8.3 g/dL   Albumin 3.5  3.5 - 5.2 g/dL   AST 24  0 - 37 U/L   ALT 20  0 - 53 U/L   Alkaline Phosphatase 95  39 - 117 U/L   Total Bilirubin 0.2 (*) 0.3 - 1.2 mg/dL   GFR calc non Af Amer >90  >90 mL/min   GFR calc Af Amer >90  >90 mL/min   Anion gap 12  5 - 15  I-STAT CG4 LACTIC ACID, ED  Result Value Ref Range   Lactic Acid, Venous 1.98  0.5 - 2.2 mmol/L     Imaging Review No results found.   EKG Interpretation None      MDM   Final diagnoses:  Wound infection, initial encounter    Patient status post  neurosurgery on thoracic vertebrae area for presumed disc herniation. This was done on Friday. Patient presents today with a greenish purulent discharge. Currently now does have serosanguineous discharge no skin erythema no new worse neuro symptoms. No documented fevers. Patient with leukocytosis. Discussed with on-call neurosurgeon MRI not indicated close followup with patient's regular neurosurgeon appropriate. Patient will call them later today.    Fredia Sorrow, MD 12/09/13 [phone]

## 2013-12-09 NOTE — ED Notes (Signed)
See downtime documentation for events prior to 0414 12/09/13.

## 2014-01-26 NOTE — ED Provider Notes (Addendum)
CSN: [id number]     Arrival date & time 12/08/13  2218 History   First MD Initiated Contact with Patient 12/09/13 [phone]     Chief Complaint  Patient presents with  . Back Pain  . Post-op Problem     (Consider location/radiation/quality/duration/timing/severity/associated sxs/prior Treatment) Patient is a 48 y.o. male presenting with back pain. The history is provided by the patient.  Back Pain Location:  Thoracic spine Associated symptoms: no abdominal pain, no chest pain, no fever, no headaches, no numbness and no weakness    S/P thoracic back surgery (T3-4) by Dr Annette Stable on Friday, now with drainage of yellow/green pus with dressing change tonight. Still with back pain but not significantly worse. Denies fever, headache, nausea vomiting.   Past Medical History  Diagnosis Date  . S/P appy   . Hernia 12/12/2010    hernia repair  . Spinal headache   . PONV (postoperative nausea and vomiting)   . Sleep apnea   . Depression   . Vitamin D deficiency   . Testosterone deficiency   . Family history of anesthesia complication     Father had problems with succinylcholine when be revived during surgery."  . Hypertension   . Environmental allergies   . Hypercholesterolemia   . Anxiety   . Rapid heart beat   . Plantar fasciitis     Bilateral feet  . Chronic back pain   . HNP (herniated nucleus pulposus), thoracic   . History of stomach ulcers   . Fibromyalgia   . Rheumatoid arthritis   . Amblyopia of left eye   . IBS (irritable bowel syndrome)   . Bronchitis, acute   . Kidney stones   . GERD (gastroesophageal reflux disease)   . Concussion     2007 severe  . Hypoglycemia   . Malignant hyperthermia     FHX of severe reaction to succinylcholine `90's (unknown if due to [place] or pseudocholinesterase def, but with hx of trigger free induction 06/06/09 [facility])   Past Surgical History  Procedure Laterality Date  . Appendectomy    . Back surgery    . Hernia repair    . Colonoscopy w/  biopsies and polypectomy    . Knee cartilage surgery      Right  . Knee surgery      torn ligament  . Cardiac catheterization      may 2010 - normal coronaries (HPR)  . Thoracic discectomy N/A 12/04/2013    Procedure: T/3-4 THORACIC DISCECTOMY;  Surgeon: Charlie Pitter, MD;  Location: [place] [facility];  Service: Neurosurgery;  Laterality: N/A;   Family History  Problem Relation Age of Onset  . COPD Father   . Fibromyalgia Father   . Sleep apnea Father   . Cancer - Prostate Father    History  Substance Use Topics  . Smoking status: Never Smoker   . Smokeless tobacco: Never Used  . Alcohol Use: Yes     Comment: social 2-3 months    Review of Systems  Constitutional: Negative for fever.  HENT: Negative for congestion.   Eyes: Negative for photophobia and visual disturbance.  Respiratory: Negative for shortness of breath.   Cardiovascular: Negative for chest pain.  Gastrointestinal: Negative for nausea, vomiting and abdominal pain.  Genitourinary: Negative for hematuria.  Musculoskeletal: Positive for back pain.  Skin: Positive for wound.  Neurological: Negative for weakness, numbness and headaches.  Hematological: Does not bruise/bleed easily.  Psychiatric/Behavioral: Negative for confusion.      Allergies  Codeine; Hydromorphone hcl; Other; and Succinylcholine chloride  Home Medications   Prior to Admission medications   Medication Sig Start Date End Date Taking? Authorizing Provider  atenolol (TENORMIN) 25 MG tablet Take 25 mg by mouth daily with supper.    Historical Provider, MD  Cholecalciferol (VITAMIN D-3) 1000 UNITS CAPS Take 3,000 Units by mouth daily with supper.     Historical Provider, MD  citalopram (CELEXA) 40 MG tablet Take 40 mg by mouth daily with supper.  11/17/13 12/17/13  Historical Provider, MD  cyclobenzaprine (AMRIX) 15 MG 24 hr capsule Take 15 mg by mouth daily as needed for muscle spasms.    Historical Provider, MD  diclofenac (VOLTAREN) 75 MG EC  tablet Take 75 mg by mouth 2 (two) times daily as needed (pain). Take with food 08/21/13 08/21/14  Historical Provider, MD  gabapentin (NEURONTIN) 300 MG capsule Take 300 mg by mouth every 8 (eight) hours as needed (pain).  10/15/13   Historical Provider, MD  lisinopril-hydrochlorothiazide (PRINZIDE,ZESTORETIC) 20-12.5 MG per tablet Take 1 tablet by mouth daily with supper.  01/23/13   Historical Provider, MD  oxyCODONE-acetaminophen (PERCOCET/ROXICET) 5-325 MG per tablet Take 1-2 tablets by mouth every 4 (four) hours as needed for moderate pain. 12/05/13   Newman Pies, MD  pravastatin (PRAVACHOL) 10 MG tablet Take 10 mg by mouth daily with supper.    Historical Provider, MD   BP 118/81  Pulse 61  Temp(Src) 97.9 F (36.6 C) (Oral)  Resp 14  Ht 5\' 11"  (1.803 m)  Wt 248 lb (112.492 kg)  BMI 34.60 kg/m2  SpO2 100% Physical Exam  Nursing note and vitals reviewed. Constitutional: He is oriented to person, place, and time. He appears well-developed and well-nourished. No distress.  HENT:  Head: Normocephalic and atraumatic.  Eyes: Conjunctivae and EOM are normal. Pupils are equal, round, and reactive to light.  Neck: Normal range of motion. Neck supple.  Cardiovascular: Normal rate, regular rhythm and normal heart sounds.   Pulmonary/Chest: Effort normal and breath sounds normal. No respiratory distress.  Abdominal: Soft. Bowel sounds are normal. There is no tenderness.  Musculoskeletal: Normal range of motion.  Midline T3-4 level surgical wound with punctate area of serosanguinous fluid discharge with slight erythema, no fluctuance, no significant induration.   Neurological: He is alert and oriented to person, place, and time. No cranial nerve deficit. He exhibits normal muscle tone. Coordination normal.  Skin: Skin is warm. There is erythema.    ED Course  Procedures (including critical care time) Labs Review Labs Reviewed  CBC WITH DIFFERENTIAL - Abnormal; Notable for the following:     WBC 13.7 (*)    Neutro Abs 8.4 (*)    Lymphs Abs 4.1 (*)    All other components within normal limits  COMPREHENSIVE METABOLIC PANEL - Abnormal; Notable for the following:    Potassium 5.5 (*)    Total Bilirubin 0.2 (*)    All other components within normal limits  I-STAT CG4 LACTIC ACID, ED   Results for orders placed during the hospital encounter of 12/09/13  CBC WITH DIFFERENTIAL      Result Value Ref Range   WBC 13.7 (*) 4.0 - 10.5 K/uL   RBC 4.53  4.22 - 5.81 MIL/uL   Hemoglobin 13.0  13.0 - 17.0 g/dL   HCT 39.2  39.0 - 52.0 %   MCV 86.5  78.0 - 100.0 fL   MCH 28.7  26.0 - 34.0 pg   MCHC 33.2  30.0 - 36.0  g/dL   RDW 13.0  11.5 - 15.5 %   Platelets 304  150 - 400 K/uL   Neutrophils Relative % 61  43 - 77 %   Neutro Abs 8.4 (*) 1.7 - 7.7 K/uL   Lymphocytes Relative 30  12 - 46 %   Lymphs Abs 4.1 (*) 0.7 - 4.0 K/uL   Monocytes Relative 7  3 - 12 %   Monocytes Absolute 0.9  0.1 - 1.0 K/uL   Eosinophils Relative 2  0 - 5 %   Eosinophils Absolute 0.3  0.0 - 0.7 K/uL   Basophils Relative 0  0 - 1 %   Basophils Absolute 0.0  0.0 - 0.1 K/uL  COMPREHENSIVE METABOLIC PANEL      Result Value Ref Range   Sodium 138  137 - 147 mEq/L   Potassium 5.5 (*) 3.7 - 5.3 mEq/L   Chloride 98  96 - 112 mEq/L   CO2 28  19 - 32 mEq/L   Glucose, Bld 85  70 - 99 mg/dL   BUN 16  6 - 23 mg/dL   Creatinine, Ser 0.94  0.50 - 1.35 mg/dL   Calcium 9.4  8.4 - 10.5 mg/dL   Total Protein 7.4  6.0 - 8.3 g/dL   Albumin 3.5  3.5 - 5.2 g/dL   AST 24  0 - 37 U/L   ALT 20  0 - 53 U/L   Alkaline Phosphatase 95  39 - 117 U/L   Total Bilirubin 0.2 (*) 0.3 - 1.2 mg/dL   GFR calc non Af Amer >90  >90 mL/min   GFR calc Af Amer >90  >90 mL/min   Anion gap 12  5 - 15  I-STAT CG4 LACTIC ACID, ED      Result Value Ref Range   Lactic Acid, Venous 1.98  0.5 - 2.2 mmol/L   No results found.    Imaging Review No results found.   EKG Interpretation None      MDM   Final diagnoses:  Wound infection,  initial encounter   Patient S/P Thoracic back surgery (T3-4)  by Dr. Annette Stable on Friday. Patient reported green/yellow purulent drainage after dressing change tonight. Wound in ED without evidence of significant wound infection and drainage of only serosanguinous fluid. No significant increase in back pain , no fever, and now new neuro deficit. Discussed with Neurosurgery on call and they okayed discharge home and will follow up in office. In addition they did not recommend MRI at this time.   Patient non toxic.   Fredia Sorrow, MD 01/26/14 1357  Fredia Sorrow, MD 01/26/14 [phone]

## 2015-08-10 ENCOUNTER — Other Ambulatory Visit: Payer: Self-pay | Admitting: Neurosurgery

## 2015-08-10 DIAGNOSIS — M5412 Radiculopathy, cervical region: Secondary | ICD-10-CM

## 2015-08-30 ENCOUNTER — Ambulatory Visit
Admission: RE | Admit: 2015-08-30 | Discharge: 2015-08-30 | Disposition: A | Discharge status: Home / Self Care | Payer: BLUE CROSS/BLUE SHIELD | Source: Ambulatory Visit | Attending: Neurosurgery | Admitting: Neurosurgery

## 2015-08-30 DIAGNOSIS — M5412 Radiculopathy, cervical region: Secondary | ICD-10-CM

## 2020-02-18 ENCOUNTER — Ambulatory Visit: Payer: Self-pay | Admitting: Allergy and Immunology

## 2020-03-30 HISTORY — PX: KIDNEY SURGERY: SHX687

## 2021-03-08 NOTE — Progress Notes (Deleted)
New Patient Note  RE: Bates Collington MRN: [id number] DOB: 10-Aug-1965 Date of Office Visit: 03/09/2021  Consult requested by: No ref. provider found Primary care provider: Pcp, No  Chief Complaint: No chief complaint on file.  History of Present Illness: I had the pleasure of seeing Corliss Lamartina for initial evaluation at the [facility] on 03/08/2021. He is a 55 y.o. male, who is referred here by Pcp, No for the evaluation of food allergy.  He reports food allergy to ***. The reaction occurred at the age of ***, after he ate *** amount of ***. Symptoms started within *** and was in the form of *** hives, swelling, wheezing, abdominal pain, diarrhea, vomiting. ***Denies any associated cofactors such as exertion, infection, NSAID use, or alcohol consumption. The symptoms lasted for ***. He was evaluated in ED and received ***. Since this episode, he does *** not report other accidental exposures to ***. He does *** not have access to epinephrine autoinjector and *** needed to use it.   Past work up includes: ***. Dietary History: patient has been eating other foods including ***milk, ***eggs, ***peanut, ***treenuts, ***sesame, ***shellfish, ***fish, ***soy, ***wheat, ***meats, ***fruits and ***vegetables.  He reports reading labels and avoiding *** in diet completely. He tolerates ***baked egg and baked milk products.    Assessment and Plan: Isa is a 55 y.o. male with: No problem-specific Assessment & Plan notes found for this encounter.  No follow-ups on file.  No orders of the defined types were placed in this encounter.  Lab Orders  No laboratory test(s) ordered today    Other allergy screening: Asthma: {Blank single:19197::"yes","no"} Rhino conjunctivitis: {Blank single:19197::"yes","no"} Food allergy: {Blank single:19197::"yes","no"} Medication allergy: {Blank single:19197::"yes","no"} Hymenoptera allergy: {Blank single:19197::"yes","no"} Urticaria: {Blank  single:19197::"yes","no"} Eczema:{Blank single:19197::"yes","no"} History of recurrent infections suggestive of immunodeficency: {Blank single:19197::"yes","no"}  Diagnostics: Spirometry:  Tracings reviewed. His effort: {Blank single:19197::"Good reproducible efforts.","It was hard to get consistent efforts and there is a question as to whether this reflects a maximal maneuver.","Poor effort, data can not be interpreted."} FVC: ***L FEV1: ***L, ***% predicted FEV1/FVC ratio: ***% Interpretation: {Blank single:19197::"Spirometry consistent with mild obstructive disease","Spirometry consistent with moderate obstructive disease","Spirometry consistent with severe obstructive disease","Spirometry consistent with possible restrictive disease","Spirometry consistent with mixed obstructive and restrictive disease","Spirometry uninterpretable due to technique","Spirometry consistent with normal pattern","No overt abnormalities noted given today's efforts"}.  Please see scanned spirometry results for details.  Skin Testing: {Blank single:19197::"Select foods","Environmental allergy panel","Environmental allergy panel and select foods","Food allergy panel","None","Deferred due to recent antihistamines use"}. *** Results discussed with patient/family.   Past Medical History: Patient Active Problem List   Diagnosis Date Noted  . Herniated nucleus pulposus with myelopathy, thoracic 12/04/2013   Past Medical History:  Diagnosis Date  . Amblyopia of left eye   . Anxiety   . Bronchitis, acute   . Chronic back pain   . Concussion    2007 severe  . Depression   . Environmental allergies   . Family history of anesthesia complication    Father had problems with succinylcholine when be revived during surgery."  . Fibromyalgia   . GERD (gastroesophageal reflux disease)   . Hernia 12/12/2010   hernia repair  . History of stomach ulcers   . HNP (herniated nucleus pulposus), thoracic   .  Hypercholesterolemia   . Hypertension   . Hypoglycemia   . IBS (irritable bowel syndrome)   . Kidney stones   . Malignant hyperthermia    FHX of severe reaction to succinylcholine `90's (  unknown if due to [place] or pseudocholinesterase def, but with hx of trigger free induction 06/06/09 [facility])  . Plantar fasciitis    Bilateral feet  . PONV (postoperative nausea and vomiting)   . Rapid heart beat   . Rheumatoid arthritis ([place])   . S/P appy   . Sleep apnea   . Spinal headache   . Testosterone deficiency   . Vitamin D deficiency    Past Surgical History: Past Surgical History:  Procedure Laterality Date  . APPENDECTOMY    . BACK SURGERY    . CARDIAC CATHETERIZATION     may 2010 - normal coronaries ([facility])  . COLONOSCOPY W/ BIOPSIES AND POLYPECTOMY    . HERNIA REPAIR    . KNEE CARTILAGE SURGERY     Right  . KNEE SURGERY     torn ligament  . THORACIC DISCECTOMY N/A 12/04/2013   Procedure: T/3-4 THORACIC DISCECTOMY;  Surgeon: Charlie Pitter, MD;  Location: [facility];  Service: Neurosurgery;  Laterality: N/A;   Medication List:  Current Outpatient Medications  Medication Sig Dispense Refill  . atenolol (TENORMIN) 25 MG tablet Take 25 mg by mouth daily with supper.    . Cholecalciferol (VITAMIN D-3) 1000 UNITS CAPS Take 3,000 Units by mouth daily with supper.     . citalopram (CELEXA) 40 MG tablet Take 40 mg by mouth daily with supper.     . cyclobenzaprine (AMRIX) 15 MG 24 hr capsule Take 15 mg by mouth daily as needed for muscle spasms.    Marland Kitchen gabapentin (NEURONTIN) 300 MG capsule Take 300 mg by mouth every 8 (eight) hours as needed (pain).     Marland Kitchen lisinopril-hydrochlorothiazide (PRINZIDE,ZESTORETIC) 20-12.5 MG per tablet Take 1 tablet by mouth daily with supper.     Marland Kitchen oxyCODONE-acetaminophen (PERCOCET/ROXICET) 5-325 MG per tablet Take 1-2 tablets by mouth every 4 (four) hours as needed for moderate pain. 100 tablet 0  . pravastatin (PRAVACHOL) 10 MG tablet Take 10 mg by mouth daily with  supper.     No current facility-administered medications for this visit.   Allergies: Allergies  Allergen Reactions  . Codeine Nausea And Vomiting  . Hydromorphone Hcl Nausea And Vomiting  . Other Nausea And Vomiting    Intolerance to darvocet  . Succinylcholine Chloride Other (See Comments)    States his dad had a severe reaction (almost could not revive him after using) Type unknown and was told anyone in his bloodline should not take this medication   Social History: Social History   Socioeconomic History  . Marital status: Divorced    Spouse name: Not on file  . Number of children: Not on file  . Years of education: Not on file  . Highest education level: Not on file  Occupational History  . Not on file  Tobacco Use  . Smoking status: Never  . Smokeless tobacco: Never  Substance and Sexual Activity  . Alcohol use: Yes    Comment: social 2-3 months  . Drug use: No  . Sexual activity: Not on file  Other Topics Concern  . Not on file  Social History Narrative  . Not on file   Social Determinants of Health   Financial Resource Strain: Not on file  Food Insecurity: Not on file  Transportation Needs: Not on file  Physical Activity: Not on file  Stress: Not on file  Social Connections: Not on file   Lives in a ***. Smoking: *** Occupation: ***  Environmental History: Water Damage/mildew in the  house: {Blank single:19197::"yes","no"} Carpet in the family room: {Blank single:19197::"yes","no"} Carpet in the bedroom: {Blank single:19197::"yes","no"} Heating: {Blank single:19197::"electric","gas","heat pump"} Cooling: {Blank single:19197::"central","window","heat pump"} Pet: {Blank single:19197::"yes ***","no"}  Family History: Family History  Problem Relation Age of Onset  . COPD Father   . Fibromyalgia Father   . Sleep apnea Father   . Cancer - Prostate Father    Problem                               Relation Asthma                                    *** Eczema                                *** Food allergy                          *** Allergic rhino conjunctivitis     ***  Review of Systems  Constitutional:  Negative for appetite change, chills, fever and unexpected weight change.  HENT:  Negative for congestion and rhinorrhea.   Eyes:  Negative for itching.  Respiratory:  Negative for cough, chest tightness, shortness of breath and wheezing.   Cardiovascular:  Negative for chest pain.  Gastrointestinal:  Negative for abdominal pain.  Genitourinary:  Negative for difficulty urinating.  Skin:  Negative for rash.  Neurological:  Negative for headaches.   Objective: There were no vitals taken for this visit. There is no height or weight on file to calculate BMI. Physical Exam Vitals and nursing note reviewed.  Constitutional:      Appearance: Normal appearance. He is well-developed.  HENT:     Head: Normocephalic and atraumatic.     Right Ear: Tympanic membrane and external ear normal.     Left Ear: Tympanic membrane and external ear normal.     Nose: Nose normal.     Mouth/Throat:     Mouth: Mucous membranes are moist.     Pharynx: Oropharynx is clear.  Eyes:     Conjunctiva/sclera: Conjunctivae normal.  Cardiovascular:     Rate and Rhythm: Normal rate and regular rhythm.     Heart sounds: Normal heart sounds. No murmur heard.   No friction rub. No gallop.  Pulmonary:     Effort: Pulmonary effort is normal.     Breath sounds: Normal breath sounds. No wheezing, rhonchi or rales.  Musculoskeletal:     Cervical back: Neck supple.  Skin:    General: Skin is warm.     Findings: No rash.  Neurological:     Mental Status: He is alert and oriented to person, place, and time.  Psychiatric:        Behavior: Behavior normal.  The plan was reviewed with the patient/family, and all questions/concerned were addressed.  It was my pleasure to see Brent Morton today and participate in his care. Please feel free to contact me with any  questions or concerns.  Sincerely,  Rexene Alberts, DO Allergy & Immunology  [facility] office: [place] office: [phone]

## 2021-03-09 ENCOUNTER — Ambulatory Visit: Payer: Medicare Other | Admitting: Allergy

## 2021-04-17 ENCOUNTER — Encounter: Payer: Self-pay | Admitting: Internal Medicine

## 2021-04-17 ENCOUNTER — Ambulatory Visit (INDEPENDENT_AMBULATORY_CARE_PROVIDER_SITE_OTHER): Payer: Medicare Other | Admitting: Internal Medicine

## 2021-04-17 ENCOUNTER — Other Ambulatory Visit: Payer: Self-pay

## 2021-04-17 VITALS — BP 142/82 | HR 78 | Temp 98.3°F | Resp 19 | Ht 71.0 in | Wt 273.2 lb

## 2021-04-17 DIAGNOSIS — D229 Melanocytic nevi, unspecified: Secondary | ICD-10-CM | POA: Diagnosis not present

## 2021-04-17 DIAGNOSIS — T7840XA Allergy, unspecified, initial encounter: Secondary | ICD-10-CM | POA: Diagnosis not present

## 2021-04-17 DIAGNOSIS — J3089 Other allergic rhinitis: Secondary | ICD-10-CM | POA: Insufficient documentation

## 2021-04-17 DIAGNOSIS — T7800XA Anaphylactic reaction due to unspecified food, initial encounter: Secondary | ICD-10-CM

## 2021-04-17 MED ORDER — EPINEPHRINE 0.3 MG/0.3ML IJ SOAJ: 0.3000 mg | INTRAMUSCULAR | 1 refills | Status: AC | PRN

## 2021-04-17 NOTE — Patient Instructions (Addendum)
Food allergy:  - today's skin testing was positive to peanut, coconut, lamb; negative to all other foods  - please strictly avoid peanut, coconut, lamb - okay to resume eating beef, pork, milk - for SKIN only reaction, okay to take Benadryl 25  mg  every 4 hours - for SKIN + ANY additional symptoms, OR IF concern for LIFE THREATENING reaction = Epipen Autoinjector EpiPen 0.3 mg. - If using Epinephrine autoinjector, call 911 - A food allergy action plan has been provided and discussed. - Medic Alert identification is recommended.    Allergic Rhinitis  - Testing today showed positive to mold, dust mite  - Copy of test results provided.  - Avoidance measures provided. - Start taking: Zyrtec (cetirizine) 10mg  tablet once daily and Flonase (fluticasone) two sprays per nostril daily - You can use an extra dose of the antihistamine, if needed, for breakthrough symptoms.  - Consider nasal saline rinses 1-2 times daily to remove allergens from the nasal cavities as well as help with mucous clearance (this is especially helpful to do before the nasal sprays are given)  Multiple Moles and Dry Skin  - please start daily moisturizer  - Dermatology referral has been placed   Follow up in 6 months   Thank you so much for letter me partake in your care today.  Don't hesitate to reach out if you have any additional concerns!  Roney Marion, MD  [facility] [place] [place]  ------------------------------------------------------------------------------------------------------------------------------------------------ DUST MITE AVOIDANCE MEASURES:  There are three main measures that need and can be taken to avoid house dust mites:  Reduce accumulation of dust in general -reduce furniture, clothing, carpeting, books, stuffed animals, especially in bedroom  Separate yourself from the dust -use pillow and mattress encasements (can be found at stores such as [employer] or  online) -avoid direct exposure to air condition flow -use a HEPA filter device, especially in the bedroom; you can also use a HEPA filter vacuum cleaner -wipe dust with a moist towel instead of a dry towel or broom when cleaning  Decrease mites and/or their secretions -wash clothing and linen and stuffed animals at highest temperature possible, at least every 2 weeks -stuffed animals can also be placed in a bag and put in a freezer overnight  Despite the above measures, it is impossible to eliminate dust mites or their allergen completely from your home.  With the above measures the burden of mites in your home can be diminished, with the goal of minimizing your allergic symptoms.  Success will be reached only when implementing and using all means together.   Control of Mold Allergen   Mold and fungi can grow on a variety of surfaces provided certain temperature and moisture conditions exist.  Outdoor molds grow on plants, decaying vegetation and soil.  The major outdoor mold, Alternaria and Cladosporium, are found in very high numbers during hot and dry conditions.  Generally, a late Summer - Fall peak is seen for common outdoor fungal spores.  Rain will temporarily lower outdoor mold spore count, but counts rise rapidly when the rainy period ends.  The most important indoor molds are Aspergillus and Penicillium.  Dark, humid and poorly ventilated basements are ideal sites for mold growth.  The next most common sites of mold growth are the bathroom and the kitchen.  Outdoor (Seasonal) Mold Control  Positive outdoor molds via skin testing: Cladosporium  Use air conditioning and keep windows closed Avoid exposure to decaying vegetation.  Avoid leaf raking. Avoid grain handling. Consider wearing a face mask if working in moldy areas.    Indoor (Perennial) Mold Control   Positive indoor molds via skin testing: Penicillium, Candida, and Trichophyton  Maintain humidity below 50%. Clean  washable surfaces with 5% bleach solution. Remove sources e.g. contaminated carpets.

## 2021-04-17 NOTE — Progress Notes (Signed)
NEW PATIENT Date of Service/Encounter:  04/17/21 Referring provider: none-self referred Primary care provider: Pcp, No  Subjective:  Brent Morton is a 55 y.o. male with a PMHx of diabetes, RA  presenting today for evaluation of food allergy  History obtained from: chart review and patient and wife .   Reports developing "funny numb tingling" sensation in face which occurs immediately after he finishes his meal and will last 30 minutes.  He has checked blood sugar during episodes and it was 180.  Reports burning sensation of tongue with tree nuts, tomato and strawberries   He had sIgE testing done which as positive to beef, pork, milk.  Denies any associated flushing, conjunctival erythema, coughing, wheezing, rhinorrhea, dizziness, nausea vomiting or diarrhea.  He is unsure whether he is allergic to beef, pork, milk and whether he should be strictly avoiding these foods.  Also reports history of seasonal rhinitis which manifests as rhinorrhea, nasal congestion and red itchy watery eyes. sIgE testing was positive to candida only.  Previous treatments include OTC antihistamines.  Has not tried any nasal sprays.    He has a history of multiple moles all over his back, chest, arms and legs.  Previously followed with dermatology but has not seen them in many years.  He is concerned that some look atypical.  He is open to it new dermatology referral. Other allergy screening: Asthma: no Rhino conjunctivitis: yes Food allergy: yes Medication allergy: no, intolerance to opiates  Hymenoptera allergy: no Urticaria: no Eczema:no History of recurrent infections suggestive of immunodeficency: no Age appropriate Vaccinations are up to date.   Past Medical History: Past Medical History:  Diagnosis Date   Amblyopia of left eye    Anxiety    Bronchitis, acute    Chronic back pain    Concussion    2007 severe   Depression    Environmental allergies    Family history of anesthesia complication     Father had problems with succinylcholine when be revived during surgery."   Fibromyalgia    GERD (gastroesophageal reflux disease)    Hernia 12/12/2010   hernia repair   History of stomach ulcers    HNP (herniated nucleus pulposus), thoracic    Hypercholesterolemia    Hypertension    Hypoglycemia    IBS (irritable bowel syndrome)    Kidney stones    Malignant hyperthermia    FHX of severe reaction to succinylcholine `90's (unknown if due to [place] or pseudocholinesterase def, but with hx of trigger free induction 06/06/09 [facility])   Plantar fasciitis    Bilateral feet   PONV (postoperative nausea and vomiting)    Rapid heart beat    Rheumatoid arthritis (HCC)    S/P appy    Sleep apnea    Spinal headache    Testosterone deficiency    Vitamin D deficiency    Medication List:  Current Outpatient Medications  Medication Sig Dispense Refill   albuterol (VENTOLIN HFA) 108 (90 Base) MCG/ACT inhaler Inhale 1-2 puffs into the lungs every 6 (six) hours as needed for wheezing or shortness of breath.     atenolol (TENORMIN) 25 MG tablet Take 25 mg by mouth daily with supper.     busPIRone (BUSPAR) 10 MG tablet Take 10 mg by mouth 2 (two) times daily.     cetirizine (ZYRTEC) 10 MG tablet Take 10 mg by mouth daily.     Cholecalciferol (VITAMIN D-3) 1000 UNITS CAPS Take 3,000 Units by mouth daily with supper.  cyclobenzaprine (AMRIX) 15 MG 24 hr capsule Take 15 mg by mouth daily as needed for muscle spasms.     DULoxetine (CYMBALTA) 60 MG capsule Take 60 mg by mouth daily.     fluticasone (FLONASE) 50 MCG/ACT nasal spray Place 2 sprays into both nostrils daily.     gabapentin (NEURONTIN) 300 MG capsule Take 300 mg by mouth every 8 (eight) hours as needed (pain).      ketoprofen (ORUDIS) 50 MG capsule Take 25 mg by mouth every 8 (eight) hours as needed.     lamoTRIgine (LAMICTAL) 25 MG tablet Take 25 mg by mouth daily.     lisinopril-hydrochlorothiazide (PRINZIDE,ZESTORETIC) 20-12.5 MG per tablet  Take 1 tablet by mouth daily with supper.      meclizine (ANTIVERT) 25 MG tablet Take 25 mg by mouth at bedtime.     meloxicam (MOBIC) 15 MG tablet Take 15 mg by mouth daily.     metoprolol succinate (TOPROL-XL) 25 MG 24 hr tablet Take 25 mg by mouth daily.     omega-3 acid ethyl esters (LOVAZA) 1 g capsule Take by mouth daily.     omeprazole (PRILOSEC) 40 MG capsule Take 40 mg by mouth in the morning and at bedtime.     ondansetron (ZOFRAN) 4 MG tablet Take 4 mg by mouth every 8 (eight) hours as needed for nausea or vomiting.     oxybutynin (DITROPAN-XL) 10 MG 24 hr tablet Take 10 mg by mouth at bedtime.     oxyCODONE-acetaminophen (PERCOCET/ROXICET) 5-325 MG per tablet Take 1-2 tablets by mouth every 4 (four) hours as needed for moderate pain. 100 tablet 0   pravastatin (PRAVACHOL) 10 MG tablet Take 10 mg by mouth daily with supper.     pseudoephedrine-acetaminophen (TYLENOL SINUS) 30-500 MG TABS tablet Take 1 tablet by mouth daily.     tamsulosin (FLOMAX) 0.4 MG CAPS capsule Take 0.4 mg by mouth at bedtime.     citalopram (CELEXA) 40 MG tablet Take 40 mg by mouth daily with supper.      No current facility-administered medications for this visit.   Known Allergies:  Allergies  Allergen Reactions   Anectine [Succinylcholine]     Other reaction(s): Other States his dad had a severe reaction ? Type unknown and was told anyone in his bloodline should not take this medication   Lac Bovis     Other reaction(s): Other Blood test to allergy   Codeine Nausea And Vomiting   Hydromorphone Hcl Nausea And Vomiting   Other Nausea And Vomiting    Intolerance to darvocet   Succinylcholine Chloride Other (See Comments)    States his dad had a severe reaction (almost could not revive him after using) Type unknown and was told anyone in his bloodline should not take this medication   Beef Allergy     Other reaction(s): Abdominal Pain   Pork Allergy     Other reaction(s): Abdominal Pain    Propoxyphene Nausea Only   Past Surgical History: Past Surgical History:  Procedure Laterality Date   APPENDECTOMY     BACK SURGERY     CARDIAC CATHETERIZATION     may 2010 - normal coronaries ([facility])   COLONOSCOPY W/ BIOPSIES AND POLYPECTOMY     HERNIA REPAIR     KNEE CARTILAGE SURGERY     Right   KNEE SURGERY     torn ligament   THORACIC DISCECTOMY N/A 12/04/2013   Procedure: T/3-4 THORACIC DISCECTOMY;  Surgeon: Charlie Pitter, MD;  Location: [place] [facility];  Service: Neurosurgery;  Laterality: N/A;   Family History: Family History  Problem Relation Age of Onset   COPD Father    Fibromyalgia Father    Sleep apnea Father    Cancer - Prostate Father    Social History: Chadley lives in a mobile home with carpet out.  Electric heaters.  Windows are clean.  They have 1 cat and 1 dog with exposure to bedroom.  There are no roaches in the home and beddings Foot.  No smoke exposure inside or outside the home.  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   ROS:  All other systems negative except as noted per HPI.  Objective:  Blood pressure (!) 142/82, pulse 78, temperature 98.3 F (36.8 C), temperature source Temporal, resp. rate 19, height 5\' 11"  (1.803 m), weight 273 lb 3.2 oz (123.9 kg), SpO2 96 %. Body mass index is 38.1 kg/m. Physical Exam:  General Appearance:  Alert, cooperative, no distress, appears stated age, using cane  Head:  Normocephalic, without obvious abnormality, atraumatic  HEENT  Conjunctiva clear, EOM's intact, TM- intact bilaterally, nasal mucosa erythematous, turbinate swollen bilaterally   Nasal polyposis not noted on limited external exam   Throat: Lips, tongue normal; teeth and gums normal, oral mucosa normal without exudates, posterior pharyngeal cobblestoning not noted  Neck: Supple, symmetrical  Lungs:   Respirations unlabored, no coughing, Breath Sounds bilaterally, no wheeze, crackles or rales  Heart:  Appears well  perfused, S1 S2 normal, no murmurs, rubs or gallops, regular rate or rhythm  Extremities: No edema  Skin: Skin color, texture, turgor normal, no rashes or lesions on visualized portions of skin, severe diffuse xerosis, multiple moles of varying appearance all over body.  Neurologic: No gross deficits   Diagnostics: Skin Testing: Environmental allergy panel and select foods. Positive test to: mold, dust mite, peanut, coconut, lamb. Negative test to: grass, trees, weed, cat, dog, mouse, horse, mixed feather, tobacco leaf and all other foods .  Results discussed with patient/family.  Airborne Adult Perc - 04/17/21 1104     Time Antigen Placed 1104    Allergen Manufacturer Lavella Hammock    Location Back    Number of Test 59    Panel 1 Select    1. Control-Buffer 50% Glycerol Negative    2. Control-Histamine 1 mg/ml 3+    3. Albumin saline Negative    4. [place] Negative    5. [place] Negative    6. [place] Negative    7. [place] Blue Negative    8. Meadow Fescue Negative    9. Perennial Rye Negative    10. Sweet Vernal Negative    11. Timothy Negative    12. Cocklebur Negative    13. Burweed Marshelder Negative    14. Ragweed, short Negative    15. Ragweed, Giant Negative    16. Plantain,  English Negative    17. Lamb's Quarters Negative    18. Sheep Sorrell Negative    19. Rough Pigweed Negative    20. Marsh Elder, Rough Negative    21. Mugwort, Common Negative    22. Ash mix Negative    23. Birch mix Negative    24. Beech American Negative    25. Box, Elder Negative    26. Cedar, red Negative    27. Cottonwood, [place] Negative    28. Elm mix Negative    29. Hickory Negative    30. Maple mix  Negative    31. Oak, [place] mix Negative    32. Pecan Pollen Negative    33. Pine mix Negative    34. Sycamore Eastern Negative    35. Arbela, Black Pollen Negative    36. Alternaria alternata Negative    37. Cladosporium Herbarum --   1+   38. Aspergillus mix Negative    39.  Penicillium mix --   1+   40. Bipolaris sorokiniana (Helminthosporium) Negative    41. Drechslera spicifera (Curvularia) Negative    42. Mucor plumbeus Negative    43. Fusarium moniliforme Negative    44. Aureobasidium pullulans (pullulara) Negative    45. Rhizopus oryzae Negative    46. Botrytis cinera Negative    47. Epicoccum nigrum Negative    48. Phoma betae Negative    49. Candida Albicans --   1+   50. Trichophyton mentagrophytes --   1+   51. Mite, D Farinae  5,000 AU/ml --   1+   52. Mite, D Pteronyssinus  5,000 AU/ml Negative    53. Cat Hair 10,000 BAU/ml Negative    54.  Dog Epithelia Negative    55. Mixed Feathers Negative    56. Horse Epithelia Negative    57. Cockroach, German Negative    58. Mouse Negative    59. Tobacco Leaf Negative             Food Adult Perc - 04/17/21 1100     Time Antigen Placed 1106    Allergen Manufacturer Lavella Hammock    Location Back    Number of allergen test 19    1. Peanut --   3*3   5. Milk, cow Negative    10. Cashew Negative    11. Pecan Food Negative    12. [place] Negative    13. Almond Negative    14. Hazelnut Negative    15. [place] nut Negative    16. Coconut --   3*3   17. Pistachio Negative    25. Shrimp Negative    37. Pork Negative    38. [place] Meat Negative    39. Chicken Meat Negative    40. Beef Negative    41. Lamb --   3*3   42. Tomato Negative    49. Onion Negative    56. Orange  Negative    60. Strawberry Negative             Allergy testing results were read and interpreted by myself, documented by clinical staff.  Assessment:  Allergy with anaphylaxis due to food - Plan: Allergy Test  Other allergic rhinitis - Plan: Allergy Test Plan/Recommendations:   Patient Instructions  Food allergy:  - today's skin testing was positive to peanut, coconut, lamb; negative to all other foods  - please strictly avoid peanut, coconut, lamb - okay to resume eating beef, pork, milk - for SKIN only  reaction, okay to take Benadryl 25  mg  every 4 hours - for SKIN + ANY additional symptoms, OR IF concern for LIFE THREATENING reaction = Epipen Autoinjector EpiPen 0.3 mg. - If using Epinephrine autoinjector, call 911 - A food allergy action plan has been provided and discussed. - Medic Alert identification is recommended.    Allergic Rhinitis  - Testing today showed positive to mold, dust mite  - Copy of test results provided.  - Avoidance measures provided. - Start taking: Zyrtec (cetirizine) 10mg  tablet once daily and Flonase (fluticasone) two sprays per nostril daily -  You can use an extra dose of the antihistamine, if needed, for breakthrough symptoms.  - Consider nasal saline rinses 1-2 times daily to remove allergens from the nasal cavities as well as help with mucous clearance (this is especially helpful to do before the nasal sprays are given)  Multiple Moles and Dry Skin  - please start daily moisturizer  - Dermatology referral has been placed   Follow up in 6 months   Thank you so much for letter me partake in your care today.  Don't hesitate to reach out if you have any additional concerns!  Roney Marion, MD  [facility] [place] [place]  ------------------------------------------------------------------------------------------------------------------------------------------------ DUST MITE AVOIDANCE MEASURES:  There are three main measures that need and can be taken to avoid house dust mites:  Reduce accumulation of dust in general -reduce furniture, clothing, carpeting, books, stuffed animals, especially in bedroom  Separate yourself from the dust -use pillow and mattress encasements (can be found at stores such as [employer] or online) -avoid direct exposure to air condition flow -use a HEPA filter device, especially in the bedroom; you can also use a HEPA filter vacuum cleaner -wipe dust with a moist towel instead of a dry towel or  broom when cleaning  Decrease mites and/or their secretions -wash clothing and linen and stuffed animals at highest temperature possible, at least every 2 weeks -stuffed animals can also be placed in a bag and put in a freezer overnight  Despite the above measures, it is impossible to eliminate dust mites or their allergen completely from your home.  With the above measures the burden of mites in your home can be diminished, with the goal of minimizing your allergic symptoms.  Success will be reached only when implementing and using all means together.   Control of Mold Allergen   Mold and fungi can grow on a variety of surfaces provided certain temperature and moisture conditions exist.  Outdoor molds grow on plants, decaying vegetation and soil.  The major outdoor mold, Alternaria and Cladosporium, are found in very high numbers during hot and dry conditions.  Generally, a late Summer - Fall peak is seen for common outdoor fungal spores.  Rain will temporarily lower outdoor mold spore count, but counts rise rapidly when the rainy period ends.  The most important indoor molds are Aspergillus and Penicillium.  Dark, humid and poorly ventilated basements are ideal sites for mold growth.  The next most common sites of mold growth are the bathroom and the kitchen.  Outdoor (Seasonal) Mold Control  Positive outdoor molds via skin testing: Cladosporium  Use air conditioning and keep windows closed Avoid exposure to decaying vegetation. Avoid leaf raking. Avoid grain handling. Consider wearing a face mask if working in moldy areas.    Indoor (Perennial) Mold Control   Positive indoor molds via skin testing: Penicillium, Candida, and Trichophyton  Maintain humidity below 50%. Clean washable surfaces with 5% bleach solution. Remove sources e.g. contaminated carpets.    Thank you for your kind referral. I appreciate the opportunity to take part in Chastin's care. Please do not hesitate to  contact me with questions.  Sincerely,  Roney Marion, MD [facility]

## 2021-10-19 ENCOUNTER — Ambulatory Visit: Payer: BLUE CROSS/BLUE SHIELD | Admitting: Physician Assistant

## 2021-10-23 ENCOUNTER — Encounter: Payer: Self-pay | Admitting: Internal Medicine

## 2021-10-23 ENCOUNTER — Other Ambulatory Visit: Payer: Self-pay

## 2021-10-23 ENCOUNTER — Ambulatory Visit (INDEPENDENT_AMBULATORY_CARE_PROVIDER_SITE_OTHER): Payer: Medicare Other | Admitting: Internal Medicine

## 2021-10-23 VITALS — BP 120/78 | HR 84 | Temp 97.8°F | Resp 20

## 2021-10-23 DIAGNOSIS — K219 Gastro-esophageal reflux disease without esophagitis: Secondary | ICD-10-CM | POA: Diagnosis not present

## 2021-10-23 DIAGNOSIS — J3089 Other allergic rhinitis: Secondary | ICD-10-CM | POA: Diagnosis not present

## 2021-10-23 DIAGNOSIS — T7800XA Anaphylactic reaction due to unspecified food, initial encounter: Secondary | ICD-10-CM

## 2021-10-30 LAB — ALLERGEN, APPLE F49
Apple: <0.1 kU/L
CLASS: 0

## 2021-10-30 LAB — INTERPRETATION:

## 2021-11-03 NOTE — Progress Notes (Signed)
Apple blood work returned negative.  I do not think he has an allergy to apples.  However if it is causing him to have oral itching I recommend to continue to avoid apples.

## 2021-11-16 ENCOUNTER — Encounter: Payer: Self-pay | Admitting: *Deleted

## 2021-12-21 ENCOUNTER — Telehealth: Payer: Self-pay

## 2021-12-21 NOTE — Telephone Encounter (Signed)
Lab results verbally given and Patient verbalized understanding. Patient stated that when he ate apple butter from a restaurant it made him feel weird and not feel well. I told him just stay away from that apple butter.

## 2022-03-31 ENCOUNTER — Emergency Department (HOSPITAL_COMMUNITY): Payer: Medicare Other

## 2022-03-31 ENCOUNTER — Encounter (HOSPITAL_COMMUNITY): Payer: Self-pay | Admitting: Emergency Medicine

## 2022-03-31 ENCOUNTER — Emergency Department (HOSPITAL_COMMUNITY)
Admission: EM | Admit: 2022-03-31 | Discharge: 2022-04-01 | Disposition: A | Discharge status: Home / Self Care | Payer: Medicare Other | Attending: Emergency Medicine | Admitting: Emergency Medicine

## 2022-03-31 ENCOUNTER — Other Ambulatory Visit: Payer: Self-pay

## 2022-03-31 DIAGNOSIS — T7840XA Allergy, unspecified, initial encounter: Secondary | ICD-10-CM | POA: Diagnosis present

## 2022-03-31 DIAGNOSIS — Z91010 Allergy to peanuts: Secondary | ICD-10-CM | POA: Diagnosis not present

## 2022-03-31 DIAGNOSIS — Z79899 Other long term (current) drug therapy: Secondary | ICD-10-CM | POA: Insufficient documentation

## 2022-03-31 DIAGNOSIS — Z7982 Long term (current) use of aspirin: Secondary | ICD-10-CM | POA: Insufficient documentation

## 2022-03-31 LAB — CBC
HCT: 41.7 % (ref 39.0–52.0)
Hemoglobin: 13.4 g/dL (ref 13.0–17.0)
MCH: 27.9 pg (ref 26.0–34.0)
MCHC: 32.1 g/dL (ref 30.0–36.0)
MCV: 86.7 fL (ref 80.0–100.0)
Platelets: 326 10*3/uL (ref 150–400)
RBC: 4.81 MIL/uL (ref 4.22–5.81)
RDW: 14.5 % (ref 11.5–15.5)
WBC: 15.1 10*3/uL — ABNORMAL HIGH (ref 4.0–10.5)
nRBC: 0 % (ref 0.0–0.2)

## 2022-03-31 LAB — COMPREHENSIVE METABOLIC PANEL
ALT: 20 U/L (ref 0–44)
AST: 20 U/L (ref 15–41)
Albumin: 3.4 g/dL — ABNORMAL LOW (ref 3.5–5.0)
Alkaline Phosphatase: 127 U/L — ABNORMAL HIGH (ref 38–126)
Anion gap: 7 (ref 5–15)
BUN: 10 mg/dL (ref 6–20)
CO2: 23 mmol/L (ref 22–32)
Calcium: 8.6 mg/dL — ABNORMAL LOW (ref 8.9–10.3)
Chloride: 106 mmol/L (ref 98–111)
Creatinine, Ser: 0.85 mg/dL (ref 0.61–1.24)
GFR, Estimated: >60 mL/min (ref >60)
Glucose, Bld: 138 mg/dL — ABNORMAL HIGH (ref 70–99)
Potassium: 3.8 mmol/L (ref 3.5–5.1)
Sodium: 136 mmol/L (ref 135–145)
Total Bilirubin: 0.4 mg/dL (ref 0.3–1.2)
Total Protein: 6.7 g/dL (ref 6.5–8.1)

## 2022-03-31 LAB — TROPONIN I (HIGH SENSITIVITY): Troponin I (High Sensitivity): 5 ng/L (ref <18)

## 2022-03-31 MED ORDER — FAMOTIDINE IN NACL 20-0.9 MG/50ML-% IV SOLN
20.0000 mg | Freq: Once | INTRAVENOUS | Status: AC
  Administered 2022-04-01: 20 mg via INTRAVENOUS
  Filled 2022-03-31: qty 50

## 2022-03-31 MED ORDER — DEXAMETHASONE SODIUM PHOSPHATE 4 MG/ML IJ SOLN
4.0000 mg | Freq: Once | INTRAMUSCULAR | Status: AC
  Administered 2022-04-01: 4 mg via INTRAVENOUS
  Filled 2022-03-31: qty 1

## 2022-03-31 MED ORDER — LORATADINE 10 MG PO TABS
10.0000 mg | ORAL_TABLET | Freq: Once | ORAL | Status: AC
  Administered 2022-04-01: 10 mg via ORAL
  Filled 2022-03-31: qty 1

## 2022-03-31 NOTE — ED Triage Notes (Signed)
PT states he ate cookies from a friend and then noted that they had tree nuts.  He began experiencing chest tightness, sob, itchy skin, weakness and burning in this throat.  Took 25 mg of benadryl at 1730 and a 2nd 25 mg at 1815.  Pt is still feeling sob, though sats are 95%.  Pt appears in no respiratory distress and airway is patent.

## 2022-03-31 NOTE — ED Provider Notes (Signed)
[facility] EMERGENCY DEPARTMENT Provider Note   CSN: [id number] Arrival date & time: 03/31/22  1854     History {Add pertinent medical, surgical, social history, OB history to HPI:1} Chief Complaint  Patient presents with   Allergic Reaction    Brent Morton is a 56 y.o. male.  The history is provided by the patient.  Allergic Reaction Presenting symptoms: itching   Presenting symptoms: no wheezing   Presenting symptoms comment:  Burning in lips Severity:  Moderate Prior allergic episodes:  Food/nut allergies (peanut allergy on skin testing) Context: not poison ivy   Relieved by:  Nothing Worsened by:  Nothing Ineffective treatments:  None tried Patient with known nut allergies ate a gingerbread biscuit that patient then was informed had nuts in it.  Patient had itching and burning in the lips and chest discomfort.  He took a benadryl and came to the ED for evaluation.  He did not use his epi pen.      Home Medications Prior to Admission medications   Medication Sig Start Date End Date Taking? Authorizing Provider  albuterol (VENTOLIN HFA) 108 (90 Base) MCG/ACT inhaler Inhale 1-2 puffs into the lungs every 6 (six) hours as needed for wheezing or shortness of breath.    [provider]  aspirin EC 81 MG tablet Take 1 tablet by mouth daily.    [provider]  busPIRone (BUSPAR) 10 MG tablet Take 10 mg by mouth 2 (two) times daily.    [provider]  cetirizine (ZYRTEC) 10 MG tablet Take 10 mg by mouth daily.    [provider]  Cholecalciferol (VITAMIN D-3) 1000 UNITS CAPS Take 3,000 Units by mouth daily with supper.     [provider]  cyclobenzaprine (AMRIX) 15 MG 24 hr capsule Take 15 mg by mouth daily as needed for muscle spasms.    [provider]  DULoxetine (CYMBALTA) 60 MG capsule Take 60 mg by mouth daily.    [provider]  EPINEPHrine 0.3 mg/0.3 mL IJ SOAJ injection Inject 0.3 mg  into the muscle as needed for anaphylaxis. 04/17/21   Roney Marion, MD  famotidine (PEPCID) 20 MG tablet Take 20 mg by mouth 2 (two) times daily. 09/17/21   [provider]  ferrous gluconate (FERGON) 324 MG tablet Take 1 tablet by mouth daily with breakfast. 09/29/20   [provider]  fluticasone (FLONASE) 50 MCG/ACT nasal spray Place 2 sprays into both nostrils daily.    [provider]  gabapentin (NEURONTIN) 300 MG capsule Take 300 mg by mouth every 8 (eight) hours as needed (pain).  10/15/13   [provider]  ketoprofen (ORUDIS) 50 MG capsule Take 25 mg by mouth every 8 (eight) hours as needed.    [provider]  lamoTRIgine (LAMICTAL) 25 MG tablet Take 25 mg by mouth daily.    [provider]  lisinopril (ZESTRIL) 20 MG tablet Take 20 mg by mouth daily. 10/11/21   [provider]  meclizine (ANTIVERT) 25 MG tablet Take 25 mg by mouth at bedtime.    [provider]  melatonin (MELATONIN MAXIMUM STRENGTH) 5 MG TABS Take by mouth. 09/30/18   [provider]  meloxicam (MOBIC) 15 MG tablet Take 15 mg by mouth daily.    [provider]  metoprolol succinate (TOPROL-XL) 25 MG 24 hr tablet Take 25 mg by mouth daily.    [provider]  nortriptyline (PAMELOR) 50 MG capsule Take 50 mg by mouth  at bedtime. 10/02/21   [provider]  omega-3 acid ethyl esters (LOVAZA) 1 g capsule Take by mouth daily.    [provider]  omeprazole (PRILOSEC) 40 MG capsule Take 1 capsule by mouth daily. 06/25/19   [provider]  ondansetron (ZOFRAN) 4 MG tablet Take 4 mg by mouth every 8 (eight) hours as needed for nausea or vomiting.    [provider]  oxybutynin (DITROPAN-XL) 10 MG 24 hr tablet Take 10 mg by mouth at bedtime.    [provider]  polyethylene glycol (MIRALAX / GLYCOLAX) 17 g packet Take by mouth.    [provider]  pravastatin (PRAVACHOL) 40 MG  tablet Take 1 tablet by mouth every evening. 05/24/17   [provider]  promethazine (PHENERGAN) 12.5 MG tablet Take 12.5 mg by mouth every 6 (six) hours as needed. 10/11/21   [provider]  pseudoephedrine-acetaminophen (TYLENOL SINUS) 30-500 MG TABS tablet Take 1 tablet by mouth daily.    [provider]  sucralfate (CARAFATE) 1 g tablet Take 1 g by mouth 4 (four) times daily. 10/15/21   [provider]  tamsulosin (FLOMAX) 0.4 MG CAPS capsule Take 0.4 mg by mouth at bedtime.    [provider]  triamcinolone cream (KENALOG) 0.1 % Apply topically 2 (two) times daily. 11/27/19   [provider]      Allergies    Anectine [succinylcholine], Milk (cow), Codeine, Dilaudid [hydromorphone], Hydromorphone hcl, Other, Peanut-containing drug products, Succinylcholine chloride, Beef allergy, Pork allergy, and Propoxyphene    Review of Systems   Review of Systems  Constitutional:  Negative for fever.  HENT:  Negative for facial swelling.   Eyes:  Negative for redness.  Respiratory:  Negative for wheezing and stridor.   Cardiovascular:  Negative for leg swelling.  Gastrointestinal:  Negative for abdominal pain.  Skin:  Positive for itching.  Neurological:  Negative for facial asymmetry.  All other systems reviewed and are negative.   Physical Exam Updated Vital Signs BP (!) 162/93 (BP Location: Right Arm)   Pulse 83   Temp 98.3 F (36.8 C)   Resp 16   Ht '5\' 11"'$  (1.803 m)   Wt 127 kg   SpO2 96%   BMI 39.05 kg/m  Physical Exam Vitals and nursing note reviewed.  Constitutional:      General: He is not in acute distress.    Appearance: Normal appearance. He is well-developed. He is not diaphoretic.  HENT:     Head: Normocephalic and atraumatic.     Nose: Nose normal.     Mouth/Throat:     Comments: No lip or oral swelling  Eyes:     Conjunctiva/sclera: Conjunctivae normal.     Pupils: Pupils are equal, round, and reactive to  light.  Cardiovascular:     Rate and Rhythm: Normal rate and regular rhythm.     Pulses: Normal pulses.     Heart sounds: Normal heart sounds.  Pulmonary:     Effort: Pulmonary effort is normal.     Breath sounds: Normal breath sounds. No wheezing or rales.  Abdominal:     General: Bowel sounds are normal.     Palpations: Abdomen is soft.     Tenderness: There is no abdominal tenderness. There is no guarding or rebound.  Musculoskeletal:        General: Normal range of motion.     Cervical back: Normal range of motion and neck supple.  Skin:    General:  Skin is warm and dry.     Capillary Refill: Capillary refill takes less than 2 seconds.  Neurological:     General: No focal deficit present.     Mental Status: He is alert and oriented to person, place, and time.     Deep Tendon Reflexes: Reflexes normal.  Psychiatric:        Mood and Affect: Mood normal.        Behavior: Behavior normal.     ED Results / Procedures / Treatments   Labs (all labs ordered are listed, but only abnormal results are displayed) Results for orders placed or performed during the hospital encounter of 03/31/22  CBC  Result Value Ref Range   WBC 15.1 (H) 4.0 - 10.5 K/uL   RBC 4.81 4.22 - 5.81 MIL/uL   Hemoglobin 13.4 13.0 - 17.0 g/dL   HCT 41.7 39.0 - 52.0 %   MCV 86.7 80.0 - 100.0 fL   MCH 27.9 26.0 - 34.0 pg   MCHC 32.1 30.0 - 36.0 g/dL   RDW 14.5 11.5 - 15.5 %   Platelets 326 150 - 400 K/uL   nRBC 0.0 0.0 - 0.2 %  Comprehensive metabolic panel  Result Value Ref Range   Sodium 136 135 - 145 mmol/L   Potassium 3.8 3.5 - 5.1 mmol/L   Chloride 106 98 - 111 mmol/L   CO2 23 22 - 32 mmol/L   Glucose, Bld 138 (H) 70 - 99 mg/dL   BUN 10 6 - 20 mg/dL   Creatinine, Ser 0.85 0.61 - 1.24 mg/dL   Calcium 8.6 (L) 8.9 - 10.3 mg/dL   Total Protein 6.7 6.5 - 8.1 g/dL   Albumin 3.4 (L) 3.5 - 5.0 g/dL   AST 20 15 - 41 U/L   ALT 20 0 - 44 U/L   Alkaline Phosphatase 127 (H) 38 - 126 U/L   Total  Bilirubin 0.4 0.3 - 1.2 mg/dL   GFR, Estimated >60 >60 mL/min   Anion gap 7 5 - 15  Troponin I (High Sensitivity)  Result Value Ref Range   Troponin I (High Sensitivity) 5 <18 ng/L   DG Chest 2 View  Result Date: 03/31/2022 CLINICAL DATA:  Chest pain, shortness of breath EXAM: CHEST - 2 VIEW COMPARISON:  12/02/2021 FINDINGS: Heart and mediastinal contours are within normal limits. No focal opacities or effusions. No acute bony abnormality. IMPRESSION: No active cardiopulmonary disease. Electronically Signed   By: Rolm Baptise M.D.   On: 03/31/2022 21:18    EKG EKG Interpretation  Date/Time:  Saturday March 31 2022 20:22:42 EST Ventricular Rate:  85 PR Interval:  136 QRS Duration: 108 QT Interval:  348 QTC Calculation: 414 R Axis:   63 Text Interpretation: Normal sinus rhythm Confirmed by Randal Buba, Jams Trickett ([id number]) on 03/31/2022 11:00:56 PM  Radiology DG Chest 2 View  Result Date: 03/31/2022 CLINICAL DATA:  Chest pain, shortness of breath EXAM: CHEST - 2 VIEW COMPARISON:  12/02/2021 FINDINGS: Heart and mediastinal contours are within normal limits. No focal opacities or effusions. No acute bony abnormality. IMPRESSION: No active cardiopulmonary disease. Electronically Signed   By: Rolm Baptise M.D.   On: 03/31/2022 21:18    Procedures Procedures  {Document cardiac monitor, telemetry assessment procedure when appropriate:1}  Medications Ordered in ED Medications  famotidine (PEPCID) IVPB 20 mg premix (has no administration in time range)  loratadine (CLARITIN) tablet 10 mg (has no administration in time range)  dexamethasone (DECADRON) injection 4 mg (has no administration in  time range)    ED Course/ Medical Decision Making/ A&P                           Medical Decision Making Patient with nut allergies who presents burning in the lips having eaten gingerbread with nuts in it   Amount and/or Complexity of Data Reviewed External Data Reviewed: notes.    Details: Previous  notes reviewed  Labs: ordered.    Details: All labs reviewed: elevated white count 15.1, normal hemoglobin 13.4, normal platelet count.  Normal sodium 138, normal potassium 3.8, normal creatinine .85.  Normal bilirubin and normal AST and ALT.  Troponin normal 5.   Radiology: ordered and independent interpretation performed.    Details: Normal CXR by me   Risk OTC drugs. Prescription drug management. Risk Details: Exam and vitals are benign and reassuring.  With tech present, patient has been educated at avoiding all nuts and following up.  PO challenged in the ED.  Stable for discharge.  Strict return.      Final Clinical Impression(s) / ED Diagnoses Final diagnoses:  None   Return for intractable cough, coughing up blood, fevers > 100.4 unrelieved by medication, shortness of breath, intractable vomiting, chest pain, shortness of breath, weakness, numbness, changes in speech, facial asymmetry, abdominal pain, passing out, Inability to tolerate liquids or food, cough, altered mental status or any concerns. No signs of systemic illness or infection. The patient is nontoxic-appearing on exam and vital signs are within normal limits.  I have reviewed the triage vital signs and the nursing notes. Pertinent labs & imaging results that were available during my care of the patient were reviewed by me and considered in my medical decision making (see chart for details). After history, exam, and medical workup I feel the patient has been appropriately medically screened and is safe for discharge home. Pertinent diagnoses were discussed with the patient. Patient was given return precautions.  Rx / DC Orders ED Discharge Orders     None

## 2022-03-31 NOTE — ED Provider Triage Note (Signed)
Emergency Medicine Provider Triage Evaluation Note  Brent Morton , a 56 y.o. male  was evaluated in triage.  Pt complains of chest pain shortness of breath. He states at 5:30 today he ate a cookie with nuts in it. States that 15 minutes after he started having crushing chest pain and shortness of breath as well as abdominal pain. States this feels unlike any allergic reaction he has ever had before. States that he took Benadryl with no relief. States his symptoms have persisted since then. Denies any mouth or lip swelling or feeling like his throat is closing.  Review of Systems  Positive:  Negative:   Physical Exam  BP (!) 161/82 (BP Location: Right Arm)   Pulse 78   Temp 97.6 F (36.4 C) (Oral)   Resp (!) 22   Ht '5\' 11"'$  (1.803 m)   Wt 127 kg   SpO2 95%   BMI 39.05 kg/m  Gen:   Awake, no distress   Resp:  Normal effort patient speaking in complete sentences MSK:   Moves extremities without difficulty  Other:  No oropharyngeal swelling  Medical Decision Making  Medically screening exam initiated at 8:38 PM.  Appropriate orders placed.  Nathanyal Ashmead was informed that the remainder of the evaluation will be completed by another provider, this initial triage assessment does not replace that evaluation, and the importance of remaining in the ED until their evaluation is complete.     Nestor Lewandowsky 03/31/22 2041

## 2022-04-01 DIAGNOSIS — T7840XA Allergy, unspecified, initial encounter: Secondary | ICD-10-CM | POA: Diagnosis not present

## 2022-04-01 LAB — TROPONIN I (HIGH SENSITIVITY): Troponin I (High Sensitivity): 5 ng/L (ref <18)

## 2022-04-01 MED ORDER — FAMOTIDINE 20 MG PO TABS: 20.0000 mg | ORAL_TABLET | Freq: Two times a day (BID) | ORAL | 0 refills | Status: AC

## 2022-04-01 NOTE — ED Notes (Signed)
RN reviewed discharge instructions with pt. Pt verbalized understanding and had no further questions. VSS upon discharge.  

## 2022-04-01 NOTE — ED Notes (Signed)
Pt reports continued mild itchiness, reports swelling has reduced. Palumbo MD made aware

## 2022-04-11 ENCOUNTER — Ambulatory Visit: Payer: Medicare Other | Admitting: Internal Medicine

## 2022-04-18 ENCOUNTER — Ambulatory Visit (INDEPENDENT_AMBULATORY_CARE_PROVIDER_SITE_OTHER): Payer: Medicare Other | Admitting: Internal Medicine

## 2022-04-18 ENCOUNTER — Encounter: Payer: Self-pay | Admitting: Internal Medicine

## 2022-04-18 VITALS — BP 136/72 | HR 114 | Temp 98.4°F | Resp 19

## 2022-04-18 DIAGNOSIS — T7800XA Anaphylactic reaction due to unspecified food, initial encounter: Secondary | ICD-10-CM

## 2022-04-18 DIAGNOSIS — J3089 Other allergic rhinitis: Secondary | ICD-10-CM | POA: Diagnosis not present

## 2022-04-18 DIAGNOSIS — R0602 Shortness of breath: Secondary | ICD-10-CM | POA: Diagnosis not present

## 2022-04-18 DIAGNOSIS — K219 Gastro-esophageal reflux disease without esophagitis: Secondary | ICD-10-CM | POA: Diagnosis not present

## 2022-04-18 DIAGNOSIS — T7800XD Anaphylactic reaction due to unspecified food, subsequent encounter: Secondary | ICD-10-CM

## 2022-04-18 MED ORDER — LEVOCETIRIZINE DIHYDROCHLORIDE 5 MG PO TABS: 5.0000 mg | ORAL_TABLET | Freq: Every evening | ORAL | 5 refills | Status: AC

## 2022-04-18 NOTE — Progress Notes (Signed)
Follow Up Note  RE: Brent Morton MRN: [id number] DOB: 16-Dec-1965 Date of Office Visit: 04/18/2022  Referring provider: Marjory Sneddon, MD Primary care provider: Marjory Sneddon, MD  Chief Complaint: Follow-up (Pt states that he have been doing well. He was started on a new inhaler by his PCP) and Allergic Rhinitis   History of Present Illness: I had the pleasure of seeing Brent Morton for a follow up visit at the [facility] on 04/24/2022. He is a 56 y.o. male, who is being followed for food allergy, allergic rhinitis, GERD . His previous allergy office visit was on 10/20/21 with Dr. Edison Pace. Today is a regular follow up visit.  History obtained from patient, chart review .  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  Labels indicated these contained tree nuts.  He is concerned about a developing tree nut allergy - 0 use of epinephrine  Rhinitis is well controlled with xyzal daily.  He is not compliant with flonase.  Denies any adverse effects   He was having DOE and was seen by pulmonary.  No concern for asthma, likely multifactorial He was started on Advair 166mg 2 puffs twice daily by pulmonary on 04/16/22.   "Impression   1. DOE (dyspnea on exertion)  2. Multiple pulmonary nodules  3. Abnormal chest CT  4. OSA on CPAP  5. Immunization deficiency  Plan   DOE: likely multifactorial with a combination of obesity, deconditioning and post COVID vs rheumatoid lung (which was not diagnosed via blood work). Echo from 2021 with EF of 55-60% with some aortic valve calcifications. - 6MWT with O2 sat stable at 99% with and without ambulation, no indication for home O2.  - PFT Ratio 83 with FEV1 74% with no reversibility, TLC 87% and DLCO 92%. Normal PFTs. - Chest CT ordered but pending, with DLCO of 92%  it is unlikely to be post COVID fibrosis but would like to see a CT to evaluate for rheumatoid lung, again unlikely with normal DLCO - Weight loss discussed at length - Exercise program - No indication for bronchodilators at this time - Continue PRN albuterol for now and patient to quantify use - Will dispense Advair 115/21 2 puffs BID, rinse mouth after to avoid - [facility] [place] [facility] [place]: Concern for rheumatoid nodules vs incidental and benign findings. Patient is a life long non-smoker - Follow up CT ordered and pending, discussed with patient and will perform then will follow up - Pending size change will consider further work up - Hold off on auto-immune panels for now  OSA: - Encouarged CPAP use - F/U with sleep doctor - Clean CPAP recommended and discussed  Immunization deficiency: - Flu vaccine refused - Pneumonia vaccine 2019 "  He was also referred to oral surgeon for fixing the teeth.     Assessment and Plan: THaois a 56y.o. male with: Allergy with anaphylaxis due to food - Plan: IgE Nut Prof. w/Component Rflx  Other allergic rhinitis  Shortness of breath  Gastroesophageal reflux disease without esophagitis Plan: Patient Instructions  Food allergy:  - Testing 04/17/21: was positive to peanut, coconut, lamb;  - Blood work 10/20/21; negative to apple  - please strictly avoid  tree nuts,  coconut, lamb - we will check blood  work for tree nuts - for SKIN only reaction, okay to take Benadryl 25  mg  every 4 hours - for SKIN + ANY additional symptoms, OR IF concern for LIFE THREATENING reaction = Epipen Autoinjector EpiPen 0.3 mg. - If using Epinephrine autoinjector, call 911 - A food allergy action plan has been provided and discussed. - Medic Alert identification is recommended.    Allergic Rhinitis  - Previous testing: 04/17/21:  positive to mold, dust mite  - Continue avoidance measure   - Continue  taking: Xyzal (levocetirizine) '5mg'$  tablet once daily and Flonase (fluticasone) two sprays per nostril daily  -FLONASE will work best if you take it ever day  - You can use an extra dose of the antihistamine, if needed, for breakthrough symptoms.  - Consider nasal saline rinses 1-2 times daily to remove allergens from the nasal cavities as well as help with mucous clearance (this is especially helpful to do before the nasal sprays are given)  GERD - continue omeprazole - continue dietary and life style modifications    Dyspnea Continue to follow with pulmonary  Continue efforts on weight loss   Follow up: we will call you with lab results.  Otherwise follow up in 6 months or sooner if problems   Good luck with your surgery!   Thank you so much for letting me partake in your care today.  Don't hesitate to reach out if you have any additional concerns!  Roney Marion, MD  [facility] [place]    No follow-ups on file.  Meds ordered this encounter  Medications   levocetirizine (XYZAL) 5 MG tablet    Sig: Take 1 tablet (5 mg total) by mouth every evening.    Dispense:  30 tablet    Refill:  5    Lab Orders         IgE Nut Prof. w/Component Rflx     Diagnostics: Spirometry:  None done     Medication List:  Current Outpatient Medications  Medication Sig Dispense Refill   albuterol (VENTOLIN HFA) 108 (90 Base) MCG/ACT inhaler Inhale 1-2 puffs into the lungs every 6 (six) hours as needed for wheezing or shortness of breath.     aspirin EC 81 MG tablet Take 1 tablet by mouth daily.     benzonatate (TESSALON) 200 MG capsule Take by mouth.     busPIRone (BUSPAR) 10 MG tablet Take 10 mg by mouth 2 (two) times daily.     Cholecalciferol (VITAMIN D-3) 1000 UNITS CAPS Take 3,000 Units by mouth daily with supper.      DULoxetine (CYMBALTA) 60 MG capsule Take 60 mg by mouth daily.     EPINEPHrine 0.3 mg/0.3 mL IJ SOAJ injection Inject 0.3 mg  into the muscle as needed for anaphylaxis. 1 each 1   famotidine (PEPCID) 20 MG tablet Take 20 mg by mouth 2 (two) times daily.     famotidine (PEPCID) 20 MG tablet Take 1 tablet (20 mg total) by mouth 2 (two) times daily. 30 tablet 0   ferrous gluconate (FERGON) 324 MG tablet Take 1 tablet by mouth daily with breakfast.     fluticasone (FLONASE) 50 MCG/ACT nasal spray Place 2 sprays into both nostrils daily.     fluticasone-salmeterol (ADVAIR HFA) 115-21 MCG/ACT inhaler Inhale into the lungs.     gabapentin (NEURONTIN) 300 MG capsule Take 300 mg by mouth every 8 (eight) hours as needed (pain).      lamoTRIgine (LAMICTAL) 25 MG  tablet Take 25 mg by mouth daily.     levocetirizine (XYZAL) 5 MG tablet Take 1 tablet (5 mg total) by mouth every evening. 30 tablet 5   lisinopril (ZESTRIL) 20 MG tablet Take 20 mg by mouth daily.     meclizine (ANTIVERT) 25 MG tablet Take 25 mg by mouth at bedtime.     meloxicam (MOBIC) 15 MG tablet Take 15 mg by mouth daily.     metoprolol succinate (TOPROL-XL) 25 MG 24 hr tablet Take 25 mg by mouth daily.     nortriptyline (PAMELOR) 50 MG capsule Take 50 mg by mouth at bedtime.     omega-3 acid ethyl esters (LOVAZA) 1 g capsule Take by mouth daily.     omeprazole (PRILOSEC) 40 MG capsule Take 1 capsule by mouth daily.     ondansetron (ZOFRAN) 4 MG tablet Take 4 mg by mouth every 8 (eight) hours as needed for nausea or vomiting.     ondansetron (ZOFRAN-ODT) 4 MG disintegrating tablet Take by mouth.     oxybutynin (DITROPAN-XL) 10 MG 24 hr tablet Take 10 mg by mouth at bedtime.     polyethylene glycol (MIRALAX / GLYCOLAX) 17 g packet Take by mouth.     pravastatin (PRAVACHOL) 40 MG tablet Take 1 tablet by mouth every evening.     promethazine (PHENERGAN) 12.5 MG tablet Take 12.5 mg by mouth every 6 (six) hours as needed.     pseudoephedrine-acetaminophen (TYLENOL SINUS) 30-500 MG TABS tablet Take 1 tablet by mouth daily.     sucralfate (CARAFATE) 1 g tablet Take 1 g  by mouth 4 (four) times daily.     tamsulosin (FLOMAX) 0.4 MG CAPS capsule Take 0.4 mg by mouth at bedtime.     tiZANidine (ZANAFLEX) 4 MG tablet Take 4 mg by mouth every 6 (six) hours as needed.     traZODone (DESYREL) 50 MG tablet Take by mouth.     triamcinolone cream (KENALOG) 0.1 % Apply topically 2 (two) times daily.     No current facility-administered medications for this visit.   Allergies: Allergies  Allergen Reactions   Ibuprofen Other (See Comments)   Anectine [Succinylcholine]     Other reaction(s): Other States his dad had a severe reaction ? Type unknown and was told anyone in his bloodline should not take this medication   Milk (Cow)     Other reaction(s): Other Blood test to allergy   Coconut Fatty Acids Other (See Comments)    unknown   Codeine Nausea And Vomiting   Dilaudid [Hydromorphone] Hives and Cough   Hydromorphone Hcl Nausea And Vomiting   Other Nausea And Vomiting    Intolerance to darvocet   Peanut-Containing Drug Products     Coconut. POSITIVE ALLERGY TEST   Succinylcholine Chloride Other (See Comments)    States his dad had a severe reaction (almost could not revive him after using) Type unknown and was told anyone in his bloodline should not take this medication   Beef Allergy     Other reaction(s): Abdominal Pain   Pork Allergy     Other reaction(s): Abdominal Pain   Propoxyphene Nausea Only   I reviewed his past medical history, social history, family history, and environmental history and no significant changes have been reported from his previous visit.  ROS: All others negative except as noted per HPI.   Objective: BP 136/72   Pulse (!) 114   Temp 98.4 F (36.9 C) (Temporal)   Resp 19  SpO2 97%  There is no height or weight on file to calculate BMI. General Appearance:  Alert, cooperative, no distress, appears stated age  Head:  Normocephalic, without obvious abnormality, atraumatic  Eyes:  Conjunctiva clear, EOM's intact  Nose:  Nares normal, hypertrophic turbinates, no visible anterior polyps, and septum midline  Throat: Lips, tongue normal; teeth and gums normal,  poor dentiton  and normal posterior oropharynx  Neck: Supple, symmetrical  Lungs:   clear to auscultation bilaterally, Respirations unlabored, no coughing  Heart:  regular rate and rhythm and no murmur, Appears well perfused  Extremities: No edema  Skin: Skin color, texture, turgor normal, no rashes or lesions on visualized portions of skin   Neurologic: No gross deficits   Previous notes and tests were reviewed. The plan was reviewed with the patient/family, and all questions/concerned were addressed.  It was my pleasure to see Heith today and participate in his care. Please feel free to contact me with any questions or concerns.  Sincerely,  Roney Marion, MD  Allergy & Immunology  [facility] Office: [phone]

## 2022-04-18 NOTE — Patient Instructions (Addendum)
Food allergy:  - Testing 04/17/21: was positive to peanut, coconut, lamb;  - Blood work 10/20/21; negative to apple  - please strictly avoid  tree nuts,  coconut, lamb - we will check blood work for tree nuts - for SKIN only reaction, okay to take Benadryl 25  mg  every 4 hours - for SKIN + ANY additional symptoms, OR IF concern for LIFE THREATENING reaction = Epipen Autoinjector EpiPen 0.3 mg. - If using Epinephrine autoinjector, call 911 - A food allergy action plan has been provided and discussed. - Medic Alert identification is recommended.    Allergic Rhinitis  - Previous testing: 04/17/21:  positive to mold, dust mite  - Continue avoidance measure  - Continue  taking: Xyzal (levocetirizine) '5mg'$  tablet once daily and Flonase (fluticasone) two sprays per nostril daily  -FLONASE will work best if you take it ever day  - You can use an extra dose of the antihistamine, if needed, for breakthrough symptoms.  - Consider nasal saline rinses 1-2 times daily to remove allergens from the nasal cavities as well as help with mucous clearance (this is especially helpful to do before the nasal sprays are given)  GERD - continue omeprazole - continue dietary and life style modifications    Dyspnea Continue to follow with pulmonary  Continue efforts on weight loss   Follow up: we will call you with lab results.  Otherwise follow up in 6 months or sooner if problems   Good luck with your surgery!   Thank you so much for letting me partake in your care today.  Don't hesitate to reach out if you have any additional concerns!  Roney Marion, MD  [facility], [place]

## 2022-04-20 LAB — IGE NUT PROF. W/COMPONENT RFLX
F017-IgE Hazelnut (Filbert): <0.1 kU/L
F018-IgE Brazil Nut: <0.1 kU/L
F020-IgE Almond: <0.1 kU/L
F202-IgE Cashew Nut: <0.1 kU/L
F203-IgE Pistachio Nut: <0.1 kU/L
F256-IgE Walnut: <0.1 kU/L
Macadamia Nut, IgE: <0.1 kU/L
Peanut, IgE: <0.1 kU/L
Pecan Nut IgE: <0.1 kU/L

## 2022-04-24 NOTE — Progress Notes (Signed)
Blood work returned negative to tree nuts.  Recommend he follow-up for skin testing for further evaluation.  At this point recommend still strict avoidance of peanuts on his reactions.  Can someone let patient know?

## 2022-12-07 ENCOUNTER — Other Ambulatory Visit: Payer: Self-pay | Admitting: Internal Medicine

## 2024-04-05 ENCOUNTER — Emergency Department (HOSPITAL_COMMUNITY)
Admission: EM | Admit: 2024-04-05 | Discharge: 2024-04-05 | Disposition: A | Discharge status: Home / Self Care | Attending: Emergency Medicine | Admitting: Emergency Medicine

## 2024-04-05 ENCOUNTER — Other Ambulatory Visit: Payer: Self-pay

## 2024-04-05 ENCOUNTER — Encounter (HOSPITAL_COMMUNITY): Payer: Self-pay | Admitting: *Deleted

## 2024-04-05 DIAGNOSIS — S39012A Strain of muscle, fascia and tendon of lower back, initial encounter: Secondary | ICD-10-CM

## 2024-04-05 MED ORDER — ACETAMINOPHEN 325 MG PO TABS
650.0000 mg | ORAL_TABLET | Freq: Once | ORAL | Status: AC
  Administered 2024-04-05: 650 mg via ORAL
  Filled 2024-04-05: qty 2

## 2024-04-05 MED ORDER — METHOCARBAMOL 500 MG PO TABS: 500.0000 mg | ORAL_TABLET | Freq: Two times a day (BID) | ORAL | 0 refills | Status: AC

## 2024-04-05 MED ORDER — LIDOCAINE 5 % EX PTCH: 1.0000 | MEDICATED_PATCH | CUTANEOUS | 0 refills | Status: AC

## 2024-04-05 MED ORDER — METHOCARBAMOL 500 MG PO TABS
500.0000 mg | ORAL_TABLET | Freq: Once | ORAL | Status: AC
  Administered 2024-04-05: 500 mg via ORAL
  Filled 2024-04-05: qty 1
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
